# Patient Record
Sex: Male | Born: 1940 | Race: Black or African American | Hispanic: No | Marital: Married | Smoking: Never smoker
Health system: Southern US, Community
[De-identification: ages and names within clinical notes are randomized; demographics above are authoritative.]

## PROBLEM LIST (undated history)

## (undated) DIAGNOSIS — J449 Chronic obstructive pulmonary disease, unspecified: Secondary | ICD-10-CM

## (undated) DIAGNOSIS — K7400 Hepatic fibrosis, unspecified: Secondary | ICD-10-CM

## (undated) DIAGNOSIS — M2550 Pain in unspecified joint: Secondary | ICD-10-CM

## (undated) DIAGNOSIS — K219 Gastro-esophageal reflux disease without esophagitis: Secondary | ICD-10-CM

## (undated) DIAGNOSIS — I639 Cerebral infarction, unspecified: Secondary | ICD-10-CM

## (undated) DIAGNOSIS — M1990 Unspecified osteoarthritis, unspecified site: Secondary | ICD-10-CM

## (undated) DIAGNOSIS — R195 Other fecal abnormalities: Secondary | ICD-10-CM

## (undated) DIAGNOSIS — B1920 Unspecified viral hepatitis C without hepatic coma: Secondary | ICD-10-CM

## (undated) DIAGNOSIS — K760 Fatty (change of) liver, not elsewhere classified: Secondary | ICD-10-CM

## (undated) DIAGNOSIS — R748 Abnormal levels of other serum enzymes: Secondary | ICD-10-CM

## (undated) DIAGNOSIS — I10 Essential (primary) hypertension: Secondary | ICD-10-CM

## (undated) DIAGNOSIS — J45909 Unspecified asthma, uncomplicated: Secondary | ICD-10-CM

---

## 2006-07-25 ENCOUNTER — Other Ambulatory Visit: Payer: Self-pay

## 2006-07-25 ENCOUNTER — Emergency Department: Payer: Self-pay

## 2006-08-28 ENCOUNTER — Emergency Department: Payer: Self-pay | Admitting: Emergency Medicine

## 2008-07-20 ENCOUNTER — Ambulatory Visit: Payer: Self-pay | Admitting: Oncology

## 2008-07-30 LAB — CBC WITH DIFFERENTIAL (CANCER CENTER ONLY)
BASO#: 0 10*3/uL (ref 0.0–0.2)
EOS%: 2.4 % (ref 0.0–7.0)
HCT: 37.3 % — ABNORMAL LOW (ref 38.7–49.9)
HGB: 12.8 g/dL — ABNORMAL LOW (ref 13.0–17.1)
LYMPH%: 30.3 % (ref 14.0–48.0)
MCH: 31.9 pg (ref 28.0–33.4)
MCHC: 34.4 g/dL (ref 32.0–35.9)
MCV: 93 fL (ref 82–98)
MONO%: 10.2 % (ref 0.0–13.0)
NEUT#: 2.7 10*3/uL (ref 1.5–6.5)
NEUT%: 56.6 % (ref 40.0–80.0)

## 2008-07-30 LAB — CMP (CANCER CENTER ONLY)
ALT(SGPT): 150 U/L — ABNORMAL HIGH (ref 10–47)
Albumin: 3.7 g/dL (ref 3.3–5.5)
CO2: 29 mEq/L (ref 18–33)
Calcium: 9.2 mg/dL (ref 8.0–10.3)
Chloride: 97 mEq/L — ABNORMAL LOW (ref 98–108)
Glucose, Bld: 102 mg/dL (ref 73–118)
Potassium: 4.2 mEq/L (ref 3.3–4.7)
Sodium: 139 mEq/L (ref 128–145)
Total Bilirubin: 0.7 mg/dl (ref 0.20–1.60)
Total Protein: 9 g/dL — ABNORMAL HIGH (ref 6.4–8.1)

## 2008-07-30 LAB — MORPHOLOGY - CHCC SATELLITE

## 2008-07-31 LAB — VITAMIN B12: Vitamin B-12: 296 pg/mL (ref 211–911)

## 2008-07-31 LAB — FERRITIN: Ferritin: 151 ng/mL (ref 22–322)

## 2008-07-31 LAB — IRON AND TIBC
%SAT: 49 % (ref 20–55)
Iron: 165 ug/dL (ref 42–165)

## 2008-07-31 LAB — KAPPA/LAMBDA LIGHT CHAINS
Kappa free light chain: 3.5 mg/dL — ABNORMAL HIGH (ref 0.33–1.94)
Lambda Free Lght Chn: 3.17 mg/dL — ABNORMAL HIGH (ref 0.57–2.63)

## 2008-08-16 LAB — CBC WITH DIFFERENTIAL (CANCER CENTER ONLY)
BASO%: 0.6 % (ref 0.0–2.0)
Eosinophils Absolute: 0.2 10*3/uL (ref 0.0–0.5)
LYMPH%: 27.8 % (ref 14.0–48.0)
MCV: 93 fL (ref 82–98)
MONO#: 0.6 10*3/uL (ref 0.1–0.9)
MONO%: 10.1 % (ref 0.0–13.0)
NEUT#: 3.1 10*3/uL (ref 1.5–6.5)
Platelets: 179 10*3/uL (ref 145–400)
RBC: 4.17 10*6/uL — ABNORMAL LOW (ref 4.20–5.70)
RDW: 11.9 % (ref 10.5–14.6)
WBC: 5.4 10*3/uL (ref 4.0–10.0)

## 2009-04-12 ENCOUNTER — Ambulatory Visit: Payer: Self-pay | Admitting: Oncology

## 2009-04-16 LAB — CBC WITH DIFFERENTIAL (CANCER CENTER ONLY)
BASO%: 0.7 % (ref 0.0–2.0)
Eosinophils Absolute: 0.1 10*3/uL (ref 0.0–0.5)
LYMPH%: 30.5 % (ref 14.0–48.0)
MCH: 31 pg (ref 28.0–33.4)
MONO%: 10.2 % (ref 0.0–13.0)
NEUT#: 2.8 10*3/uL (ref 1.5–6.5)
Platelets: 138 10*3/uL — ABNORMAL LOW (ref 145–400)
RBC: 4.54 10*6/uL (ref 4.20–5.70)
RDW: 12.4 % (ref 10.5–14.6)
WBC: 5.1 10*3/uL (ref 4.0–10.0)

## 2009-04-16 LAB — CMP (CANCER CENTER ONLY)
ALT(SGPT): 61 U/L — ABNORMAL HIGH (ref 10–47)
AST: 64 U/L — ABNORMAL HIGH (ref 11–38)
Albumin: 3.8 g/dL (ref 3.3–5.5)
Alkaline Phosphatase: 60 U/L (ref 26–84)
Glucose, Bld: 100 mg/dL (ref 73–118)
Potassium: 4.3 mEq/L (ref 3.3–4.7)
Sodium: 141 mEq/L (ref 128–145)
Total Bilirubin: 0.7 mg/dl (ref 0.20–1.60)
Total Protein: 9 g/dL — ABNORMAL HIGH (ref 6.4–8.1)

## 2012-10-08 ENCOUNTER — Emergency Department: Payer: Self-pay | Admitting: Emergency Medicine

## 2014-05-28 ENCOUNTER — Ambulatory Visit
Admit: 2014-05-28 | Disposition: A | Discharge status: Home / Self Care | Payer: Self-pay | Attending: Gastroenterology | Admitting: Gastroenterology

## 2014-05-30 ENCOUNTER — Ambulatory Visit
Admit: 2014-05-30 | Disposition: A | Discharge status: Home / Self Care | Payer: Self-pay | Attending: Gastroenterology | Admitting: Gastroenterology

## 2015-04-27 ENCOUNTER — Encounter: Payer: Self-pay | Admitting: Emergency Medicine

## 2015-04-27 ENCOUNTER — Emergency Department
Admission: EM | Admit: 2015-04-27 | Discharge: 2015-04-28 | Disposition: A | Discharge status: Home / Self Care | Payer: Medicare Other | Attending: Emergency Medicine | Admitting: Emergency Medicine

## 2015-04-27 DIAGNOSIS — F1010 Alcohol abuse, uncomplicated: Secondary | ICD-10-CM | POA: Insufficient documentation

## 2015-04-27 DIAGNOSIS — T7840XA Allergy, unspecified, initial encounter: Secondary | ICD-10-CM | POA: Diagnosis present

## 2015-04-27 DIAGNOSIS — I10 Essential (primary) hypertension: Secondary | ICD-10-CM | POA: Diagnosis not present

## 2015-04-27 DIAGNOSIS — Z720 Tobacco use: Secondary | ICD-10-CM | POA: Diagnosis not present

## 2015-04-27 DIAGNOSIS — J45909 Unspecified asthma, uncomplicated: Secondary | ICD-10-CM | POA: Insufficient documentation

## 2015-04-27 DIAGNOSIS — I639 Cerebral infarction, unspecified: Secondary | ICD-10-CM | POA: Insufficient documentation

## 2015-04-27 DIAGNOSIS — T782XXA Anaphylactic shock, unspecified, initial encounter: Secondary | ICD-10-CM

## 2015-04-27 DIAGNOSIS — T7802XA Anaphylactic reaction due to shellfish (crustaceans), initial encounter: Secondary | ICD-10-CM | POA: Insufficient documentation

## 2015-04-27 HISTORY — DX: Unspecified viral hepatitis C without hepatic coma: B19.20

## 2015-04-27 HISTORY — DX: Essential (primary) hypertension: I10

## 2015-04-27 HISTORY — DX: Cerebral infarction, unspecified: I63.9

## 2015-04-27 HISTORY — DX: Chronic obstructive pulmonary disease, unspecified: J44.9

## 2015-04-27 HISTORY — DX: Unspecified asthma, uncomplicated: J45.909

## 2015-04-27 MED ORDER — DEXAMETHASONE SODIUM PHOSPHATE 10 MG/ML IJ SOLN
INTRAMUSCULAR | Status: AC
  Administered 2015-04-27: 10 mg via INTRAVENOUS
  Filled 2015-04-27: qty 1

## 2015-04-27 MED ORDER — FAMOTIDINE IN NACL 20-0.9 MG/50ML-% IV SOLN
20.0000 mg | Freq: Once | INTRAVENOUS | Status: AC
  Administered 2015-04-27: 20 mg via INTRAVENOUS

## 2015-04-27 MED ORDER — EPINEPHRINE HCL 1 MG/ML IJ SOLN: 1.0000 mg | Freq: Once | INTRAMUSCULAR | Status: DC

## 2015-04-27 MED ORDER — ALBUTEROL SULFATE (2.5 MG/3ML) 0.083% IN NEBU
INHALATION_SOLUTION | RESPIRATORY_TRACT | Status: AC
  Filled 2015-04-27: qty 3

## 2015-04-27 MED ORDER — EPINEPHRINE HCL 1 MG/ML IJ SOLN
0.3000 mg | Freq: Once | INTRAMUSCULAR | Status: AC
  Administered 2015-04-27: 0.3 mg via INTRAMUSCULAR

## 2015-04-27 MED ORDER — ALBUTEROL SULFATE (2.5 MG/3ML) 0.083% IN NEBU
2.5000 mg | INHALATION_SOLUTION | Freq: Once | RESPIRATORY_TRACT | Status: AC
  Administered 2015-04-27: 2.5 mg via RESPIRATORY_TRACT

## 2015-04-27 MED ORDER — SODIUM CHLORIDE 0.9 % IV BOLUS (SEPSIS)
1000.0000 mL | Freq: Once | INTRAVENOUS | Status: AC
  Administered 2015-04-27: 1000 mL via INTRAVENOUS

## 2015-04-27 MED ORDER — PREDNISONE 20 MG PO TABS: 60.0000 mg | ORAL_TABLET | Freq: Every day | ORAL | Status: DC

## 2015-04-27 MED ORDER — EPINEPHRINE 0.3 MG/0.3ML IJ SOAJ: 0.3000 mg | Freq: Once | INTRAMUSCULAR | Status: DC

## 2015-04-27 MED ORDER — ALBUTEROL SULFATE (2.5 MG/3ML) 0.083% IN NEBU
INHALATION_SOLUTION | RESPIRATORY_TRACT | Status: AC
  Administered 2015-04-27: 2.5 mg via RESPIRATORY_TRACT
  Filled 2015-04-27: qty 3

## 2015-04-27 MED ORDER — EPINEPHRINE HCL 1 MG/ML IJ SOLN
INTRAMUSCULAR | Status: AC
  Filled 2015-04-27: qty 1

## 2015-04-27 MED ORDER — EPINEPHRINE HCL 0.1 MG/ML IJ SOSY
PREFILLED_SYRINGE | INTRAMUSCULAR | Status: AC
  Filled 2015-04-27: qty 10

## 2015-04-27 MED ORDER — FAMOTIDINE IN NACL 20-0.9 MG/50ML-% IV SOLN
INTRAVENOUS | Status: AC
  Administered 2015-04-27: 20 mg via INTRAVENOUS
  Filled 2015-04-27: qty 50

## 2015-04-27 MED ORDER — DEXAMETHASONE SODIUM PHOSPHATE 10 MG/ML IJ SOLN
10.0000 mg | Freq: Once | INTRAMUSCULAR | Status: AC
  Administered 2015-04-27: 10 mg via INTRAVENOUS

## 2015-04-27 NOTE — ED Notes (Signed)
Patient SpO2 dropped to 86%. Placed on 5L [place]. MD made aware.

## 2015-04-27 NOTE — ED Notes (Signed)
Patient here for allergic reaction. Allergic to shrimp. Patient ate shrimp, didn't think he would have "this bad of a reaction". BP 84/61. MD at bedside. Voice is hoarse.

## 2015-04-27 NOTE — ED Notes (Signed)
Pt O2 at 98% RA on [place] 3L, decreased O2 to 2L. Pt O2 sat at 96

## 2015-04-27 NOTE — ED Notes (Signed)
Patient stating 99% on 5L [place], turned down to 3L [place]. SpO2 97%.

## 2015-04-27 NOTE — Discharge Instructions (Signed)
Anaphylactic Reaction °An anaphylactic reaction is a sudden, severe allergic reaction. It affects the whole body. It can be life threatening. You may need to stay in the hospital.  °HOME CARE °· Wear a medical bracelet or necklace that lists your allergy. °· Carry your allergy kit or medicine shot to treat severe allergic reactions with you. These can save your life. °· Do not drive until medicine from your shot has worn off, unless your doctor says it is okay. °· If you have hives or a rash: °¨ Take medicine as told by your doctor. °¨ You may take over-the-counter antihistamine medicine. °¨ Place cold cloths on your skin. Take baths in cool water. Avoid hot baths and hot showers. °GET HELP RIGHT AWAY IF:  °· Your mouth is puffy (swollen), or you have trouble breathing. °· You start making whistling sounds when you breathe (wheezing). °· You have a tight feeling in your chest or throat. °· You have a rash, hives, puffiness, or itching on your body. °· You throw up (vomit) or have watery poop (diarrhea). °· You feel dizzy or pass out (faint). °· You think you are having an allergic reaction. °· You have new symptoms. °This is an emergency. Use your medicine shot or allergy kit as told. Call your local emergency services (911 in [place] Even if you feel better after the shot, you need to go to the hospital emergency department. °MAKE SURE YOU:  °· Understand these instructions. °· Will watch your condition. °· Will get help right away if you are not doing well or get worse. °  °This information is not intended to replace advice given to you by your health care provider. Make sure you discuss any questions you have with your health care provider. °  °Document Released: 07/15/2007 Document Revised: 07/28/2011 Document Reviewed: 08/08/2014 °Elsevier Interactive Patient Education ©2016 [employer] Inc. ° °

## 2015-04-27 NOTE — ED Provider Notes (Addendum)
[facility] Emergency Department Provider Note  ____________________________________________  Time seen: Seen upon arrival to the emergency department  I have reviewed the triage vital signs and the nursing notes.   HISTORY  Chief Complaint Allergic Reaction    HPI Larry Parsons is a 75 y.o. male with a history of a shellfish allergy who ate shrimp about an hour prior to arrival. Soon after eating the shrimp she began to become itchy as well as have a hoarse voice. His symptoms have progressed and he came to the emergency department because of them. He does not have an EpiPen at home. He did take 50 mg of Benadryl prior to arrival without any improvement. He says he feels like his throat is currently closing.   Past Medical History  Diagnosis Date  . Asthma   . Hypertension   . Stroke ([place])   . Hepatitis C     There are no active problems to display for this patient.   No past surgical history on file.  Current Outpatient Rx  Name  Route  Sig  Dispense  Refill  . allopurinol (ZYLOPRIM) 100 MG tablet   Oral   Take 100 mg by mouth daily.         Marland Kitchen amLODipine (NORVASC) 10 MG tablet   Oral   Take 10 mg by mouth daily.         . carvedilol (COREG) 25 MG tablet   Oral   Take 25 mg by mouth 2 (two) times daily with a meal.         . febuxostat (ULORIC) 40 MG tablet   Oral   Take 40 mg by mouth daily.         . montelukast (SINGULAIR) 10 MG tablet   Oral   Take 10 mg by mouth at bedtime.         Marland Kitchen omega-3 acid ethyl esters (LOVAZA) 1 g capsule   Oral   Take 1 g by mouth daily.         Marland Kitchen omeprazole (PRILOSEC) 20 MG capsule   Oral   Take 20 mg by mouth daily.         . tamsulosin (FLOMAX) 0.4 MG CAPS capsule   Oral   Take 0.4 mg by mouth daily.         Marland Kitchen tiotropium (SPIRIVA) 18 MCG inhalation capsule   Inhalation   Place 18 mcg into inhaler and inhale daily.           Allergies Shrimp  No family history on  file.  Social History Social History  Substance Use Topics  . Smoking status: Never Smoker   . Smokeless tobacco: Current User    Types: Chew  . Alcohol Use: Yes     Comment: 3x/week (1 beer)    Review of Systems Constitutional: No fever/chills Eyes: No visual changes. ENT: No sore throat. Cardiovascular: Denies chest pain. Respiratory: Reports difficulty breathing. Gastrointestinal: No abdominal pain.  No nausea, no vomiting.  No diarrhea.  No constipation. Genitourinary: Negative for dysuria. Musculoskeletal: Negative for back pain. Skin: Itchiness diffusely. Neurological: Negative for headaches, focal weakness or numbness.  10-point ROS otherwise negative.  ____________________________________________   PHYSICAL EXAM:  VITAL SIGNS: ED Triage Vitals  Enc Vitals Group     BP 04/27/15 2159 84/61 mmHg     Pulse Rate 04/27/15 2159 70     Resp 04/27/15 2159 15     Temp 04/27/15 2159 97.4 F (36.3 C)  Temp src --      SpO2 04/27/15 2159 97 %     Weight 04/27/15 2159 180 lb (81.647 kg)     Height 04/27/15 2159 5\' 9"  (1.753 m)     Head Cir --      Peak Flow --      Pain Score --      Pain Loc --      Pain Edu? --      Excl. in [place]? --     Constitutional: Alert and oriented. Well appearing and in no acute distress. Eyes: Conjunctivae are normal. PERRL. EOMI. Head: Atraumatic. Nose: No congestion/rhinnorhea. Mouth/Throat: Mucous membranes are moist.  Oropharynx non-erythematous.Controlling his secretions. No tongue or uvular swelling. Neck: No stridor.   Cardiovascular: Normal rate, regular rhythm. Grossly normal heart sounds.  Good peripheral circulation. Respiratory: Normal respiratory effort.  Severely decreased air movement throughout. Gastrointestinal: Soft and nontender. No distention. No abdominal bruits. No CVA tenderness. Musculoskeletal: No lower extremity tenderness nor edema.  No joint effusions. Neurologic:  Normal speech and language. No gross  focal neurologic deficits are appreciated. No gait instability. Skin: Urticarial rash to the bilateral upper extremities which is more pronounced on the right upper extremity. Psychiatric: Mood and affect are normal. Speech and behavior are normal.  ____________________________________________   LABS (all labs ordered are listed, but only abnormal results are displayed)  Labs Reviewed - No data to display ____________________________________________  EKG  ED ECG REPORT I, [place],  Youlanda Roys, the attending physician, personally viewed and interpreted this ECG.   Date: 04/27/2015  EKG Time: 2156  Rate: 69  Rhythm: normal sinus rhythm  Axis: Normal  Intervals:Prolonged QT.  ST&T Change: No ST segment elevation or depression. No abnormal T-wave inversion.  ____________________________________________  RADIOLOGY   ____________________________________________   PROCEDURES  CRITICAL CARE Performed by: Doran Stabler   Total critical care time: 35 minutes  Critical care time was exclusive of separately billable procedures and treating other patients.  Critical care was necessary to treat or prevent imminent or life-threatening deterioration.  Critical care was time spent personally by me on the following activities: development of treatment plan with patient and/or surrogate as well as nursing, discussions with consultants, evaluation of patient's response to treatment, examination of patient, obtaining history from patient or surrogate, ordering and performing treatments and interventions, ordering and review of laboratory studies, ordering and review of radiographic studies, pulse oximetry and re-evaluation of patient's condition.  Patient initially in anaphylactic shock with hypoxia to 82-85% on room air as well as systolic pressure as low as 82. Was initially given epinephrine, albuterol, Pepcid as well as Decadron  IV. ____________________________________________   INITIAL IMPRESSION / ASSESSMENT AND PLAN / ED COURSE  Pertinent labs & imaging results that were available during my care of the patient were reviewed by me and considered in my medical decision making (see chart for details).  ----------------------------------------- 11:51 PM on 04/27/2015 -----------------------------------------  Patient at this point has been able to be weaned down to 2-3 L on nasal cannula and is satting at 98%. His voice is less hoarse. He is clear to auscultation bilaterally. His rash has resolved as well as itchiness. We will continue to observe him for a full 4 hours because of his initial anaphylactic shock. Signed out to Dr. Dahlia Client. I discussed this. Of observation with the patient as well as the family. They know that if he continues to improve that he will likely be able to be discharged home  with a seven-day course of steroids and an EpiPen as needed. He knows that he must never returned again or any other shellfish. EKG done by ER tech. Patient never experience any chest pain. ____________________________________________   FINAL CLINICAL IMPRESSION(S) / ED DIAGNOSES  Anaphylactic shock.    Orbie Pyo, MD 04/27/15 [phone]  Signed out to Dr. Dahlia Client.    Orbie Pyo, MD 04/27/15 [phone]

## 2015-04-28 ENCOUNTER — Emergency Department: Payer: Medicare Other

## 2015-04-28 LAB — CBC
HCT: 43.1 % (ref 40.0–52.0)
HEMOGLOBIN: 14.1 g/dL (ref 13.0–18.0)
MCH: 30.7 pg (ref 26.0–34.0)
MCHC: 32.7 g/dL (ref 32.0–36.0)
MCV: 93.9 fL (ref 80.0–100.0)
Platelets: 146 10*3/uL — ABNORMAL LOW (ref 150–440)
RBC: 4.59 MIL/uL (ref 4.40–5.90)
RDW: 13.4 % (ref 11.5–14.5)
WBC: 7.5 10*3/uL (ref 3.8–10.6)

## 2015-04-28 LAB — BASIC METABOLIC PANEL
Anion gap: 7 (ref 5–15)
BUN: 15 mg/dL (ref 6–20)
CHLORIDE: 105 mmol/L (ref 101–111)
CO2: 24 mmol/L (ref 22–32)
Calcium: 8.7 mg/dL — ABNORMAL LOW (ref 8.9–10.3)
Creatinine, Ser: 1.07 mg/dL (ref 0.61–1.24)
GFR calc Af Amer: >60 mL/min (ref >60)
GFR calc non Af Amer: >60 mL/min (ref >60)
Glucose, Bld: 137 mg/dL — ABNORMAL HIGH (ref 65–99)
POTASSIUM: 3.7 mmol/L (ref 3.5–5.1)
SODIUM: 136 mmol/L (ref 135–145)

## 2015-04-28 LAB — TROPONIN I

## 2015-04-28 NOTE — ED Notes (Signed)
Pt O2 sat decreased to 87% on RA. Placed pt on 0.5L [place]. Pt O2 sat  92% on RA

## 2015-04-28 NOTE — ED Notes (Signed)
Informed MD Dahlia Client of pt's decrease in O2 sat.

## 2015-04-28 NOTE — ED Notes (Signed)
Pt asked if he was having any SOB, or discomfort. Pt reported he was having mild chest tightness. Pt reported he often has same sensation at home, takes his singulair, and the discomfort goes away. Pt also reported he was recently diagnosed with COPD. COPD was added to pt's history. MD informed.

## 2015-04-28 NOTE — ED Notes (Signed)
Pt O2 sat at 98% on 2L [place]. Decreased O2 to 1L. Pt O2 Sat 97%.

## 2015-04-28 NOTE — ED Notes (Signed)
Pt taken to Xray.

## 2015-04-28 NOTE — ED Provider Notes (Signed)
-----------------------------------------   3:45 AM on 04/28/2015 -----------------------------------------   Blood pressure 147/88, pulse 90, temperature 97.4 F (36.3 C), resp. rate 14, height 5\' 9"  (1.753 m), weight 180 lb (81.647 kg), SpO2 93 %.  Assuming care from Dr. Clearnce Hasten.  In short, Larry Parsons is a 75 y.o. male with a chief complaint of Allergic Reaction .  Refer to the original H&P for additional details.  The current plan of care is to the patient for 2 more hours to ensure that he does not have a return of his symptoms..  We took the patient off of his nasal cannula and he had some episodes of hypoxia. Initially the concern was that he was having some shortness of breath or chest tightness from a return of the symptoms. We initially placed the patient back on nasal cannula and continue to monitor him. After talking with the patient he does have a diagnosis of COPD and reports that he does sometimes have some lower oxygen saturations before he takes his Spriva at night. I did go in to listen to the patient and his lung sounds were clear. He reports that some of the breathing feels more like his COPD shortness of breath. The patient reports that his voice is no longer hoarse and he feels much better than he did when he initially came into the hospital. I allowed the patient to take his Spriva and we also reassess the patient's oxygen saturation. The patient's sats in the high 80s seemed to get lower when he had his blood pressure taken on the arm which is where the oxygen probe is. At this time I feel that the low oxygen results are spurious because he is having some normal oxygen saturation on room air at this time. The patient reports that he feels comfortable being discharged home. The patient be discharged to follow-up with his primary care physician.  We did do some blood work in the emergency department and it was all unremarkable.  Chest x-ray: No acute cardiopulmonary findings,  curvilinear scarring in both bases.  ED ECG REPORT I, Loney Hering, the attending physician, personally viewed and interpreted this ECG.   Date: 04/28/2015  EKG Time: 110  Rate: 87  Rhythm: normal sinus rhythm  Axis: none  Intervals:none  ST&T Change: none    Loney Hering, MD 04/28/15 [phone]

## 2015-04-28 NOTE — ED Notes (Signed)
Pt O2 sat 95% on 1L [place]. Took pt off O2, Pt O2 sat 93% on RA

## 2016-09-16 IMAGING — US ABDOMEN ULTRASOUND LIMITED
1 series · 14 of 25 positions shown · non-contrast
Comparison: None.

CLINICAL DATA: Evaluate for cirrhosis, hepatitis-C

EXAM:
US ABDOMEN LIMITED - RIGHT UPPER QUADRANT

[Series 1: abdomen ultrasound limited · 0.20mm/px · 14 of 105 slices shown]
[im 1/105]
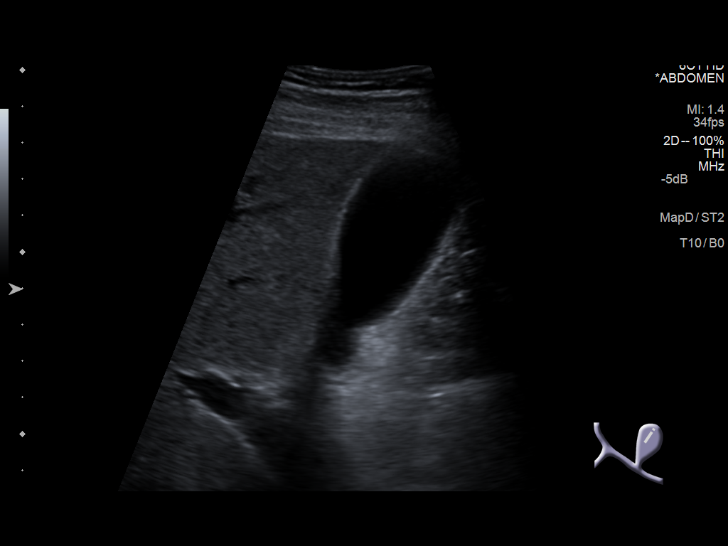
[im 9/105]
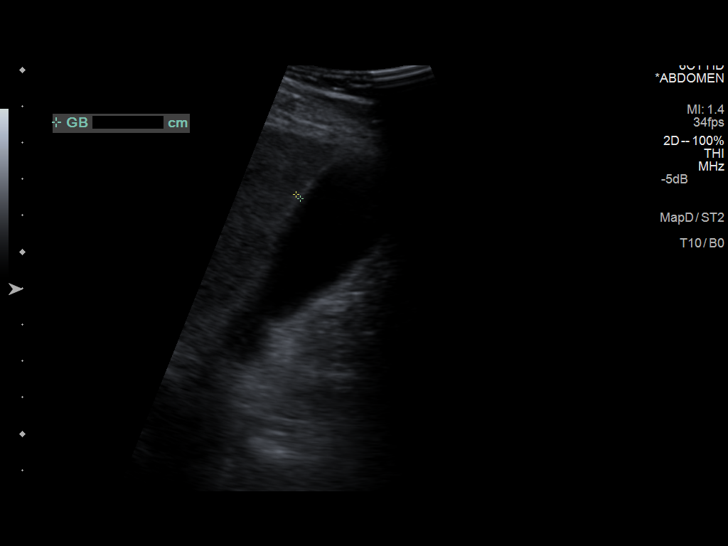
[im 18/105]
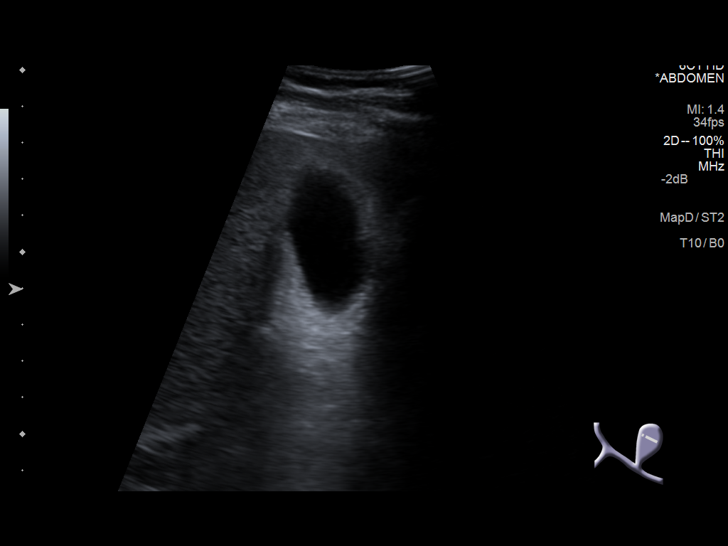
[im 27/105]
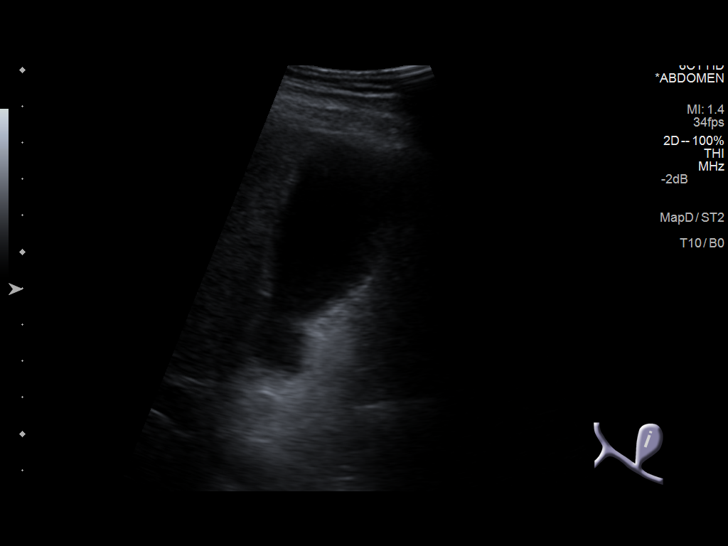
[im 35/105]
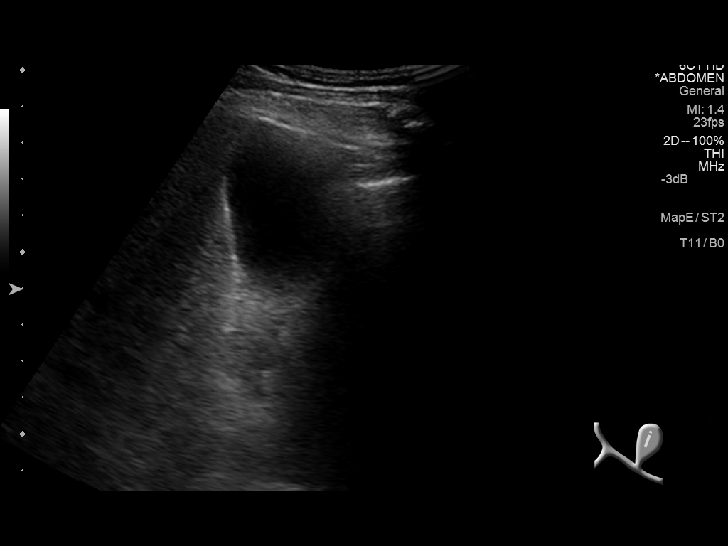
[im 40/105]
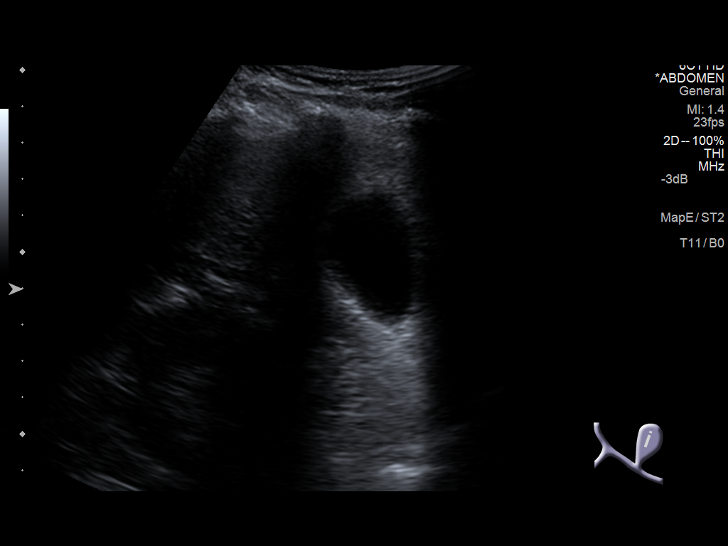
[im 48/105]
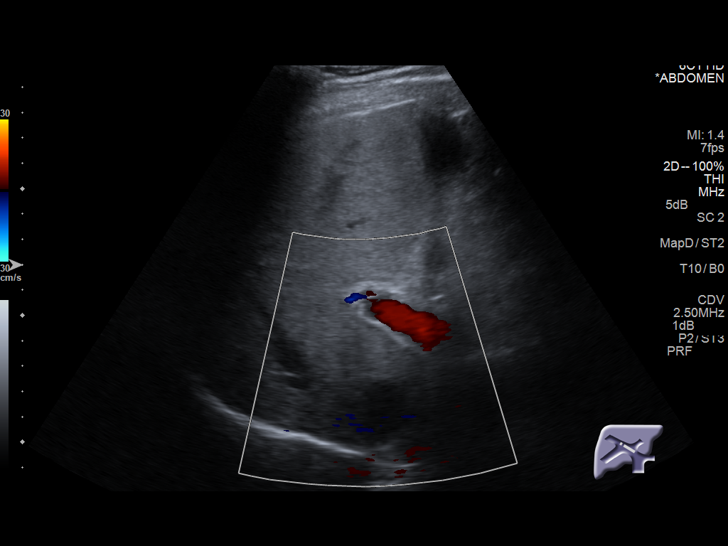
[im 57/105]
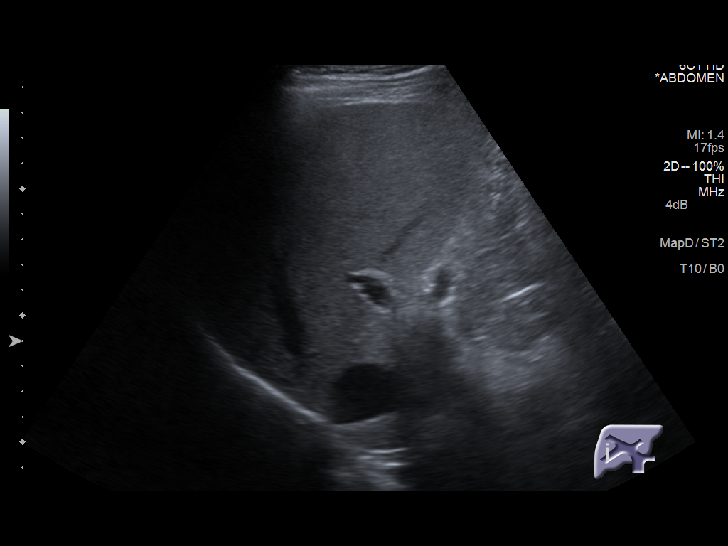
[im 66/105]
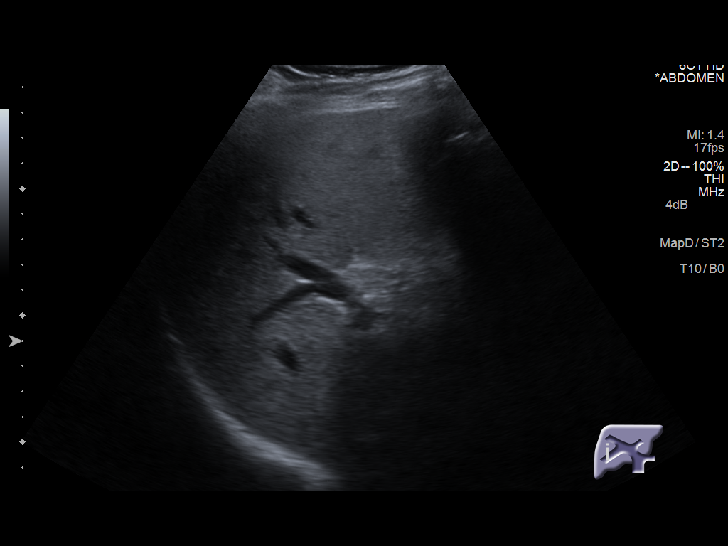
[im 70/105]
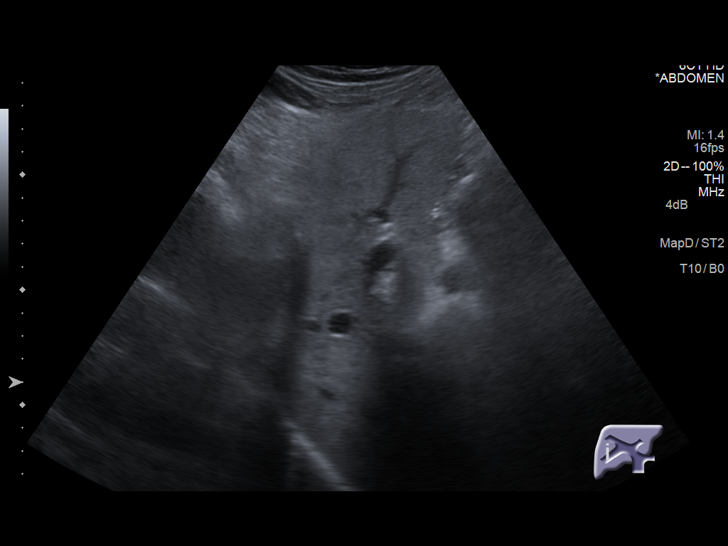
[im 79/105]
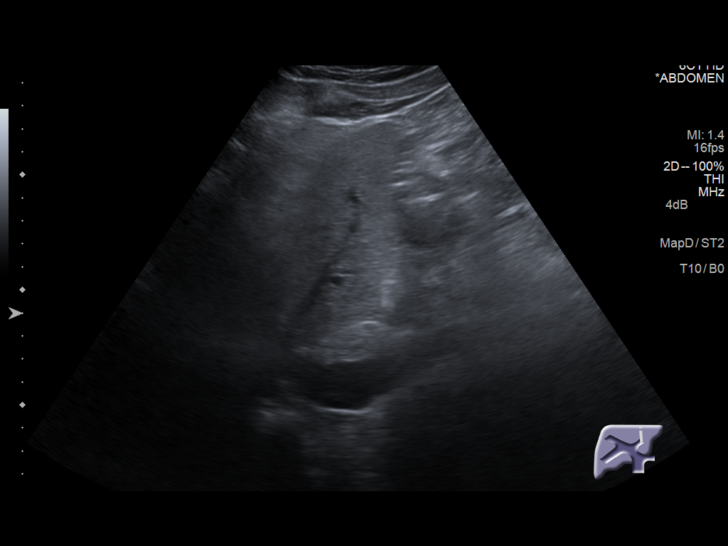
[im 87/105]
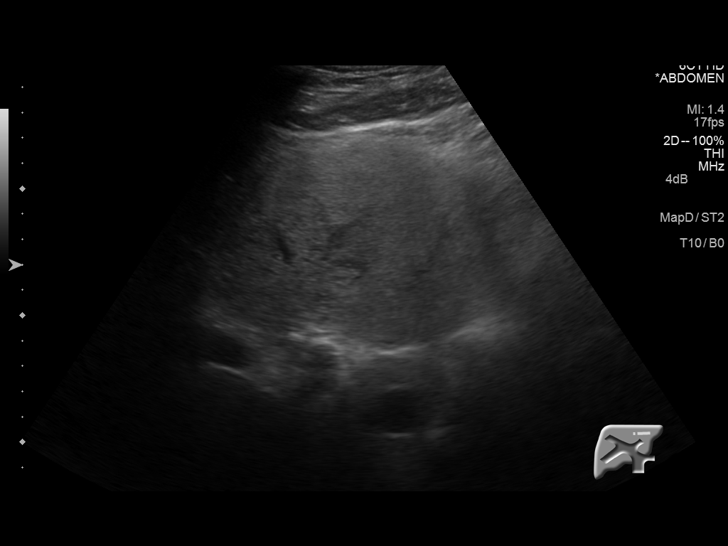
[im 96/105]
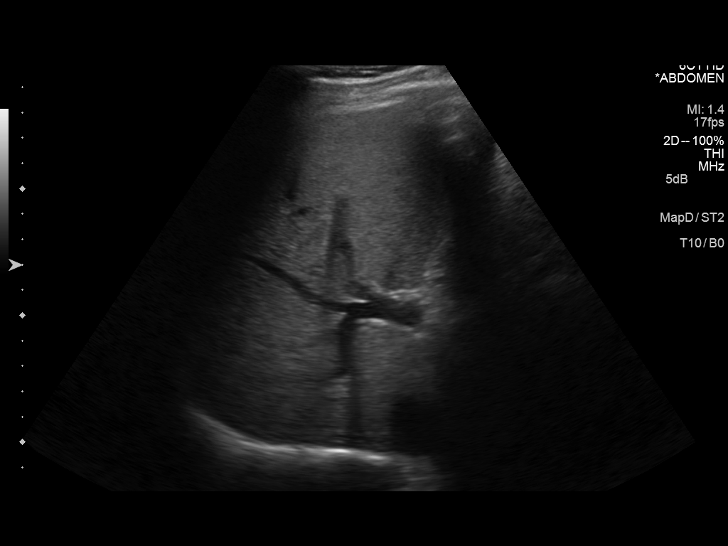
[im 105/105]
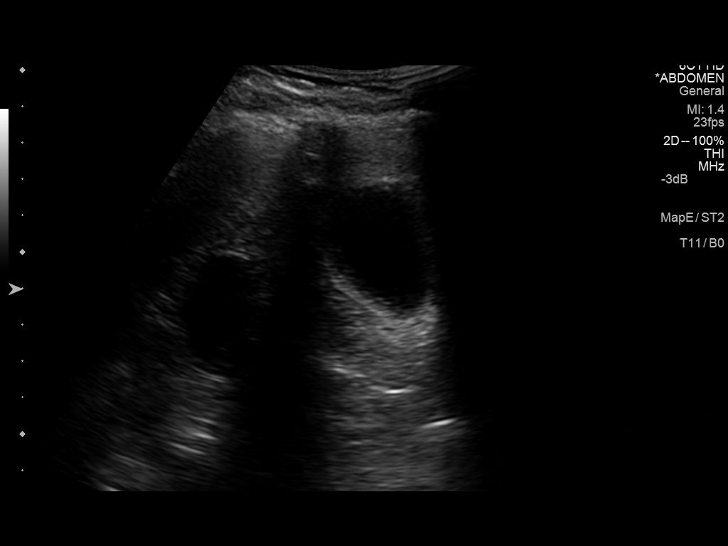

[14 of 25 positions shown; findings below may reference images not displayed]

FINDINGS: Gallbladder:

No gallstones or wall thickening visualized. No sonographic Murphy
sign noted.

Common bile duct:

Diameter: 4.8 mm in diameter within normal limits.

Liver:

No focal lesion identified. Mild increased echogenicity of the liver
suspicious for chronic hepatitis or fatty infiltration.
IMPRESSION: 1. No gallstones.  Normal CBD.
2. Mild increased echogenicity of the liver suspicious for fatty
infiltration or chronic hepatitis. No focal hepatic mass.

## 2017-03-17 ENCOUNTER — Encounter: Payer: Self-pay | Admitting: *Deleted

## 2017-03-17 ENCOUNTER — Ambulatory Visit
Admission: RE | Admit: 2017-03-17 | Discharge: 2017-03-17 | Disposition: A | Discharge status: Home / Self Care | Payer: Medicare Other | Source: Ambulatory Visit | Attending: Internal Medicine | Admitting: Internal Medicine

## 2017-03-17 ENCOUNTER — Other Ambulatory Visit: Payer: Self-pay

## 2017-03-17 ENCOUNTER — Ambulatory Visit: Payer: Medicare Other | Admitting: Anesthesiology

## 2017-03-17 ENCOUNTER — Encounter
Admission: RE | Disposition: A | Discharge status: Home / Self Care | Payer: Self-pay | Source: Ambulatory Visit | Attending: Internal Medicine

## 2017-03-17 DIAGNOSIS — D123 Benign neoplasm of transverse colon: Secondary | ICD-10-CM | POA: Diagnosis not present

## 2017-03-17 DIAGNOSIS — Z1211 Encounter for screening for malignant neoplasm of colon: Secondary | ICD-10-CM | POA: Insufficient documentation

## 2017-03-17 DIAGNOSIS — K640 First degree hemorrhoids: Secondary | ICD-10-CM | POA: Diagnosis not present

## 2017-03-17 DIAGNOSIS — Z91013 Allergy to seafood: Secondary | ICD-10-CM | POA: Insufficient documentation

## 2017-03-17 DIAGNOSIS — C19 Malignant neoplasm of rectosigmoid junction: Secondary | ICD-10-CM | POA: Insufficient documentation

## 2017-03-17 DIAGNOSIS — K5730 Diverticulosis of large intestine without perforation or abscess without bleeding: Secondary | ICD-10-CM | POA: Diagnosis not present

## 2017-03-17 DIAGNOSIS — Z8673 Personal history of transient ischemic attack (TIA), and cerebral infarction without residual deficits: Secondary | ICD-10-CM | POA: Insufficient documentation

## 2017-03-17 DIAGNOSIS — Z79899 Other long term (current) drug therapy: Secondary | ICD-10-CM | POA: Diagnosis not present

## 2017-03-17 DIAGNOSIS — K740 Hepatic fibrosis: Secondary | ICD-10-CM | POA: Diagnosis not present

## 2017-03-17 DIAGNOSIS — Z888 Allergy status to other drugs, medicaments and biological substances status: Secondary | ICD-10-CM | POA: Diagnosis not present

## 2017-03-17 DIAGNOSIS — B1920 Unspecified viral hepatitis C without hepatic coma: Secondary | ICD-10-CM | POA: Diagnosis not present

## 2017-03-17 DIAGNOSIS — I10 Essential (primary) hypertension: Secondary | ICD-10-CM | POA: Insufficient documentation

## 2017-03-17 DIAGNOSIS — K760 Fatty (change of) liver, not elsewhere classified: Secondary | ICD-10-CM | POA: Insufficient documentation

## 2017-03-17 DIAGNOSIS — J449 Chronic obstructive pulmonary disease, unspecified: Secondary | ICD-10-CM | POA: Diagnosis not present

## 2017-03-17 DIAGNOSIS — K219 Gastro-esophageal reflux disease without esophagitis: Secondary | ICD-10-CM | POA: Diagnosis not present

## 2017-03-17 DIAGNOSIS — Z880 Allergy status to penicillin: Secondary | ICD-10-CM | POA: Insufficient documentation

## 2017-03-17 HISTORY — DX: Hepatic fibrosis, unspecified: K74.00

## 2017-03-17 HISTORY — DX: Hepatic fibrosis: K74.0

## 2017-03-17 HISTORY — DX: Fatty (change of) liver, not elsewhere classified: K76.0

## 2017-03-17 HISTORY — DX: Unspecified osteoarthritis, unspecified site: M19.90

## 2017-03-17 HISTORY — DX: Abnormal levels of other serum enzymes: R74.8

## 2017-03-17 HISTORY — PX: COLONOSCOPY WITH PROPOFOL: SHX5780

## 2017-03-17 HISTORY — DX: Gastro-esophageal reflux disease without esophagitis: K21.9

## 2017-03-17 SURGERY — COLONOSCOPY WITH PROPOFOL
Anesthesia: General

## 2017-03-17 MED ORDER — PROPOFOL 10 MG/ML IV BOLUS
INTRAVENOUS | Status: DC | PRN
  Administered 2017-03-17 (×2): 20 mg via INTRAVENOUS
  Administered 2017-03-17: 40 mg via INTRAVENOUS

## 2017-03-17 MED ORDER — LIDOCAINE HCL (PF) 2 % IJ SOLN
INTRAMUSCULAR | Status: DC | PRN
  Administered 2017-03-17: 80 mg via INTRADERMAL

## 2017-03-17 MED ORDER — PROPOFOL 500 MG/50ML IV EMUL
INTRAVENOUS | Status: DC | PRN
  Administered 2017-03-17: 100 ug/kg/min via INTRAVENOUS
  Administered 2017-03-17: 80 ug/kg/min via INTRAVENOUS

## 2017-03-17 MED ORDER — SODIUM CHLORIDE 0.9 % IV SOLN
INTRAVENOUS | Status: DC
  Administered 2017-03-17 (×2): via INTRAVENOUS

## 2017-03-17 NOTE — Interval H&P Note (Signed)
History and Physical Interval Note:  03/17/2017 11:18 AM  Larry Parsons  has presented today for surgery, with the diagnosis of SCREENING  The various methods of treatment have been discussed with the patient and family. After consideration of risks, benefits and other options for treatment, the patient has consented to  Procedure(s): COLONOSCOPY WITH PROPOFOL (N/A) as a surgical intervention .  The patient's history has been reviewed, patient examined, no change in status, stable for surgery.  I have reviewed the patient's chart and labs.  Questions were answered to the patient's satisfaction.     [place], [place]

## 2017-03-17 NOTE — Anesthesia Postprocedure Evaluation (Signed)
Anesthesia Post Note  Patient: Buford Dresser  Procedure(s) Performed: COLONOSCOPY WITH PROPOFOL (N/A )  Patient location during evaluation: Endoscopy Anesthesia Type: General Level of consciousness: awake and alert Pain management: pain level controlled Vital Signs Assessment: post-procedure vital signs reviewed and stable Respiratory status: spontaneous breathing, nonlabored ventilation, respiratory function stable and patient connected to nasal cannula oxygen Cardiovascular status: blood pressure returned to baseline and stable Postop Assessment: no apparent nausea or vomiting Anesthetic complications: no     Last Vitals:  Vitals:   03/17/17 1033  BP: (!) 184/88  Pulse: (!) 56  Resp: 18  Temp: (!) 35.7 C  SpO2: 97%    Last Pain:  Vitals:   03/17/17 1033  TempSrc: Tympanic                 Arnetra Terris,  [facility]

## 2017-03-17 NOTE — Anesthesia Post-op Follow-up Note (Signed)
Anesthesia QCDR form completed.        

## 2017-03-17 NOTE — H&P (Signed)
Outpatient short stay form Pre-procedure 03/17/2017 11:15 AM Larry Parsons K. Alice Reichert, M.D.  Primary Physician: Venetia Maxon. Elijio Miles, M.D.  Reason for visit:  + Cologuard test  History of present illness:  Patient's pleasant 77 year old African-American male presenting with positive Cologuard screening test. Patient denies a change in bowel habits, rectal bleeding or abdominal pain.    Current Facility-Administered Medications:  .  0.9 %  sodium chloride infusion, , Intravenous, Continuous, Braxton, Benay Pike, MD, Last Rate: 20 mL/hr at 03/17/17 1050  Medications Prior to Admission  Medication Sig Dispense Refill Last Dose  . allopurinol (ZYLOPRIM) 100 MG tablet Take 100 mg by mouth daily.   03/16/2017 at Unknown time  . amLODipine (NORVASC) 10 MG tablet Take 10 mg by mouth daily.   03/16/2017 at Unknown time  . betamethasone dipropionate (DIPROLENE) 0.05 % cream Apply topically 2 (two) times daily.   03/16/2017 at Unknown time  . budesonide-formoterol (SYMBICORT) 80-4.5 MCG/ACT inhaler Inhale 2 puffs into the lungs 2 (two) times daily.   03/17/2017 at 0730  . carvedilol (COREG) 25 MG tablet Take 25 mg by mouth 2 (two) times daily with a meal.   03/16/2017 at Unknown time  . febuxostat (ULORIC) 40 MG tablet Take 40 mg by mouth daily.   03/16/2017 at Unknown time  . fexofenadine (ALLEGRA) 180 MG tablet Take 180 mg by mouth daily.   03/16/2017 at Unknown time  . fluticasone (FLONASE) 50 MCG/ACT nasal spray Place into both nostrils daily.   03/16/2017 at Unknown time  . montelukast (SINGULAIR) 10 MG tablet Take 10 mg by mouth at bedtime.   03/16/2017 at Unknown time  . omega-3 acid ethyl esters (LOVAZA) 1 g capsule Take 1 g by mouth daily.   Past Week at Unknown time  . omeprazole (PRILOSEC) 20 MG capsule Take 20 mg by mouth daily.   03/16/2017 at Unknown time  . tamsulosin (FLOMAX) 0.4 MG CAPS capsule Take 0.4 mg by mouth daily.   03/16/2017 at Unknown time  . tiotropium (SPIRIVA) 18 MCG inhalation capsule Place 18 mcg  into inhaler and inhale daily.   03/16/2017 at Unknown time  . EPINEPHrine (EPIPEN 2-PAK) 0.3 mg/0.3 mL IJ SOAJ injection Inject 0.3 mLs (0.3 mg total) into the muscle once. 1 Device 0   . predniSONE (DELTASONE) 20 MG tablet Take 3 tablets (60 mg total) by mouth daily with breakfast. 21 tablet 0      Allergies  Allergen Reactions  . Shrimp [Shellfish Allergy] Anaphylaxis  . Lisinopril   . Penicillins      Past Medical History:  Diagnosis Date  . Arthritis   . Asthma   . COPD (chronic obstructive pulmonary disease) ([place])   . Elevated liver enzymes   . Fatty liver   . GERD (gastroesophageal reflux disease)   . Hepatic fibrosis   . Hepatitis C   . Hypertension   . Stroke [facility])     Review of systems:   Negative except for that in history of present illness.   Physical Exam  General appearance: alert, cooperative and appears stated age Resp: clear to auscultation bilaterally Cardio: regular rate and rhythm, S1, S2 normal, no murmur, click, rub or gallop GI: soft, non-tender; bowel sounds normal; no masses,  no organomegaly Extremities: extremities normal, atraumatic, no cyanosis or edema     Planned procedures: Proceed with colonoscopy.The patient understands the nature of the planned procedure, indications, risks, alternatives and potential complications including but not limited to bleeding, infection, perforation, damage to internal organs  and possible oversedation/side effects from anesthesia. The patient agrees and gives consent to proceed.  Please refer to procedure notes for findings, recommendations and patient disposition/instructions.    Stepanie Graver K. Alice Reichert, M.D. Gastroenterology 03/17/2017  11:15 AM

## 2017-03-17 NOTE — Transfer of Care (Signed)
Immediate Anesthesia Transfer of Care Note  Patient: Larry Parsons  Procedure(s) Performed: COLONOSCOPY WITH PROPOFOL (N/A )  Patient Location: PACU and Endoscopy Unit  Anesthesia Type:General  Level of Consciousness: sedated  Airway & Oxygen Therapy: Patient Spontanous Breathing  Post-op Assessment: Report given to RN and Post -op Vital signs reviewed and stable  Post vital signs: Reviewed and stable  Last Vitals:  Vitals:   03/17/17 1033  BP: (!) 184/88  Pulse: (!) 56  Resp: 18  Temp: (!) 35.7 C  SpO2: 97%    Last Pain:  Vitals:   03/17/17 1033  TempSrc: Tympanic         Complications: No apparent anesthesia complications

## 2017-03-17 NOTE — Anesthesia Preprocedure Evaluation (Signed)
Anesthesia Evaluation  Patient identified by MRN, date of birth, ID band Patient awake    Reviewed: Allergy & Precautions, H&P , NPO status , Patient's Chart, lab work & pertinent test results  History of Anesthesia Complications Negative for: history of anesthetic complications  Airway Mallampati: III  TM Distance: <3 FB Neck ROM: limited    Dental  (+) Poor Dentition, Missing, Edentulous Upper, Edentulous Lower, Upper Dentures, Lower Dentures   Pulmonary asthma , COPD,           Cardiovascular hypertension, (-) angina(-) Past MI and (-) DOE      Neuro/Psych CVA, Residual Symptoms negative psych ROS   GI/Hepatic GERD  Medicated and Controlled,(+) Hepatitis -, C  Endo/Other  negative endocrine ROS  Renal/GU negative Renal ROS  negative genitourinary   Musculoskeletal   Abdominal   Peds  Hematology negative hematology ROS (+)   Anesthesia Other Findings Past Medical History: No date: Arthritis No date: Asthma No date: COPD (chronic obstructive pulmonary disease) ([facility]) No date: Elevated liver enzymes No date: Fatty liver No date: GERD (gastroesophageal reflux disease) No date: Hepatic fibrosis No date: Hepatitis C No date: Hypertension No date: Stroke [facility])  History reviewed. No pertinent surgical history.  BMI    Body Mass Index:  25.40 kg/m      Reproductive/Obstetrics negative OB ROS                             Anesthesia Physical Anesthesia Plan  ASA: III  Anesthesia Plan: General   Post-op Pain Management:    Induction: Intravenous  PONV Risk Score and Plan: Propofol infusion  Airway Management Planned: Natural Airway and Nasal Cannula  Additional Equipment:   Intra-op Plan:   Post-operative Plan:   Informed Consent: I have reviewed the patients History and Physical, chart, labs and discussed the procedure including the risks, benefits and alternatives for  the proposed anesthesia with the patient or authorized representative who has indicated his/her understanding and acceptance.   Dental Advisory Given  Plan Discussed with: Anesthesiologist, CRNA and Surgeon  Anesthesia Plan Comments: (Patient consented for risks of anesthesia including but not limited to:  - adverse reactions to medications - risk of intubation if required - damage to teeth, lips or other oral mucosa - sore throat or hoarseness - Damage to heart, brain, lungs or loss of life  Patient voiced understanding.)        Anesthesia Quick Evaluation

## 2017-03-17 NOTE — Op Note (Signed)
[facility] Gastroenterology Patient Name: Larry Parsons Procedure Date: 03/17/2017 11:16 AM MRN: [id number] Account #: [id number] Date of Birth: 09/27/1940 Admit Type: Outpatient Age: 77 Room: [facility] ENDO ROOM 3 Gender: Male Note Status: Finalized Procedure:            Colonoscopy Indications:          Positive Cologuard test Providers:            Benay Pike. Alice Reichert MD, MD Referring MD:         Venetia Maxon. Elijio Miles, MD (Referring MD) Medicines:            Propofol per Anesthesia Complications:        No immediate complications. Procedure:            Pre-Anesthesia Assessment:                       - The risks and benefits of the procedure and the                        sedation options and risks were discussed with the                        patient. All questions were answered and informed                        consent was obtained.                       - Patient identification and proposed procedure were                        verified prior to the procedure by the nurse. The                        procedure was verified in the procedure room.                       - ASA Grade Assessment: III - A patient with severe                        systemic disease.                       - After reviewing the risks and benefits, the patient                        was deemed in satisfactory condition to undergo the                        procedure.                       After obtaining informed consent, the colonoscope was                        passed under direct vision. Throughout the procedure,                        the patient's blood pressure, pulse, and oxygen  saturations were monitored continuously. The                        Colonoscope was introduced through the anus and                        advanced to the the cecum, identified by appendiceal                        orifice and ileocecal valve. The colonoscopy was                         performed without difficulty. The patient tolerated the                        procedure well. The quality of the bowel preparation                        was good. The ileocecal valve, appendiceal orifice, and                        rectum were photographed. Findings:      The perianal and digital rectal examinations were normal. Pertinent       negatives include normal sphincter tone and no palpable rectal lesions.      A few small-mouthed diverticula were found in the sigmoid colon.      A 3 mm polyp was found in the transverse colon. The polyp was sessile.       The polyp was removed with a jumbo cold forceps. Resection and retrieval       were complete.      A 10 mm polyp was found in the rectum. The polyp was sessile. The polyp       was removed with a hot snare. Resection and retrieval were complete. The       polyp was removed with a hot snare. Resection and retrieval were       complete. To prevent bleeding after the polypectomy, one hemostatic clip       was successfully placed. There was no bleeding during, or at the end, of       the procedure.      Non-bleeding internal hemorrhoids were found during retroflexion. The       hemorrhoids were Grade I (internal hemorrhoids that do not prolapse).      The exam was otherwise without abnormality. Impression:           - Diverticulosis in the sigmoid colon.                       - One 3 mm polyp in the transverse colon, removed with                        a jumbo cold forceps. Resected and retrieved.                       - One 10 mm polyp in the rectum, removed with a hot                        snare. Resected and retrieved.                       -  Non-bleeding internal hemorrhoids.                       - The examination was otherwise normal. Recommendation:       - Patient has a contact number available for                        emergencies. The signs and symptoms of potential                        delayed complications were  discussed with the patient.                        Return to normal activities tomorrow. Written discharge                        instructions were provided to the patient.                       - Resume previous diet.                       - Continue present medications.                       - Await pathology results.                       - Repeat colonoscopy is recommended for surveillance.                        The colonoscopy date will be determined after pathology                        results from today's exam become available for review.                       - The findings and recommendations were discussed with                        the patient and their family.                       - Patient has a contact number available for                        emergencies. The signs and symptoms of potential                        delayed complications were discussed with the patient.                        Return to normal activities tomorrow. Written discharge                        instructions were provided to the patient.                       - Resume previous diet.                       - Continue present medications.                       -  Await pathology results.                       - Repeat colonoscopy is recommended for surveillance.                        The colonoscopy date will be determined after pathology                        results from today's exam become available for review.                       - Return to GI office PRN.                       - The findings and recommendations were discussed with                        the patient and their spouse. Procedure Code(s):    --- Professional ---                       [phone], Colonoscopy, flexible; with removal of tumor(s),                        polyp(s), or other lesion(s) by snare technique                       45380, 48, Colonoscopy, flexible; with biopsy, single                        or  multiple Diagnosis Code(s):    --- Professional ---                       K57.30, Diverticulosis of large intestine without                        perforation or abscess without bleeding                       R19.5, Other fecal abnormalities                       K62.1, Rectal polyp                       D12.3, Benign neoplasm of transverse colon (hepatic                        flexure or splenic flexure)                       K64.0, First degree hemorrhoids CPT copyright 2016 American Medical Association. All rights reserved. The codes documented in this report are preliminary and upon coder review may  be revised to meet current compliance requirements. Efrain Sella MD, MD 03/17/2017 11:55:21 AM This report has been signed electronically. Number of Addenda: 0 Note Initiated On: 03/17/2017 11:16 AM Scope Withdrawal Time: 0 hours 11 minutes 17 seconds  Total Procedure Duration: 0 hours 16 minutes 19 seconds       [facility]

## 2017-03-18 ENCOUNTER — Encounter: Payer: Self-pay | Admitting: Internal Medicine

## 2017-03-19 LAB — SURGICAL PATHOLOGY

## 2017-04-12 ENCOUNTER — Encounter: Payer: Self-pay | Admitting: *Deleted

## 2017-04-13 ENCOUNTER — Ambulatory Visit: Payer: Medicare Other | Admitting: Registered Nurse

## 2017-04-13 ENCOUNTER — Encounter: Payer: Self-pay | Admitting: *Deleted

## 2017-04-13 ENCOUNTER — Encounter
Admission: RE | Disposition: A | Discharge status: Home / Self Care | Payer: Self-pay | Source: Ambulatory Visit | Attending: Internal Medicine

## 2017-04-13 ENCOUNTER — Ambulatory Visit
Admission: RE | Admit: 2017-04-13 | Discharge: 2017-04-13 | Disposition: A | Discharge status: Home / Self Care | Payer: Medicare Other | Source: Ambulatory Visit | Attending: Internal Medicine | Admitting: Internal Medicine

## 2017-04-13 DIAGNOSIS — I10 Essential (primary) hypertension: Secondary | ICD-10-CM | POA: Insufficient documentation

## 2017-04-13 DIAGNOSIS — Z880 Allergy status to penicillin: Secondary | ICD-10-CM | POA: Insufficient documentation

## 2017-04-13 DIAGNOSIS — J449 Chronic obstructive pulmonary disease, unspecified: Secondary | ICD-10-CM | POA: Diagnosis not present

## 2017-04-13 DIAGNOSIS — Z8673 Personal history of transient ischemic attack (TIA), and cerebral infarction without residual deficits: Secondary | ICD-10-CM | POA: Diagnosis not present

## 2017-04-13 DIAGNOSIS — Z888 Allergy status to other drugs, medicaments and biological substances status: Secondary | ICD-10-CM | POA: Insufficient documentation

## 2017-04-13 DIAGNOSIS — Z79899 Other long term (current) drug therapy: Secondary | ICD-10-CM | POA: Insufficient documentation

## 2017-04-13 DIAGNOSIS — K760 Fatty (change of) liver, not elsewhere classified: Secondary | ICD-10-CM | POA: Insufficient documentation

## 2017-04-13 DIAGNOSIS — M1990 Unspecified osteoarthritis, unspecified site: Secondary | ICD-10-CM | POA: Diagnosis not present

## 2017-04-13 DIAGNOSIS — Z91013 Allergy to seafood: Secondary | ICD-10-CM | POA: Insufficient documentation

## 2017-04-13 DIAGNOSIS — K626 Ulcer of anus and rectum: Secondary | ICD-10-CM | POA: Insufficient documentation

## 2017-04-13 DIAGNOSIS — Z85048 Personal history of other malignant neoplasm of rectum, rectosigmoid junction, and anus: Secondary | ICD-10-CM | POA: Diagnosis not present

## 2017-04-13 DIAGNOSIS — B1920 Unspecified viral hepatitis C without hepatic coma: Secondary | ICD-10-CM | POA: Diagnosis not present

## 2017-04-13 DIAGNOSIS — K740 Hepatic fibrosis: Secondary | ICD-10-CM | POA: Insufficient documentation

## 2017-04-13 DIAGNOSIS — K219 Gastro-esophageal reflux disease without esophagitis: Secondary | ICD-10-CM | POA: Insufficient documentation

## 2017-04-13 DIAGNOSIS — Z08 Encounter for follow-up examination after completed treatment for malignant neoplasm: Secondary | ICD-10-CM | POA: Insufficient documentation

## 2017-04-13 HISTORY — PX: FLEXIBLE SIGMOIDOSCOPY: SHX5431

## 2017-04-13 SURGERY — SIGMOIDOSCOPY, FLEXIBLE
Anesthesia: General

## 2017-04-13 MED ORDER — PROPOFOL 10 MG/ML IV BOLUS
INTRAVENOUS | Status: DC | PRN
  Administered 2017-04-13: 60 mg via INTRAVENOUS

## 2017-04-13 MED ORDER — PROPOFOL 500 MG/50ML IV EMUL
INTRAVENOUS | Status: DC | PRN
  Administered 2017-04-13: 140 ug/kg/min via INTRAVENOUS

## 2017-04-13 MED ORDER — SPOT INK MARKER SYRINGE KIT
PACK | SUBMUCOSAL | Status: DC | PRN
  Administered 2017-04-13: 5 mL via SUBMUCOSAL

## 2017-04-13 MED ORDER — SODIUM CHLORIDE 0.9 % IV SOLN
INTRAVENOUS | Status: DC
  Administered 2017-04-13: 1000 mL via INTRAVENOUS

## 2017-04-13 NOTE — Anesthesia Preprocedure Evaluation (Signed)
Anesthesia Evaluation  Patient identified by MRN, date of birth, ID band Patient awake    Reviewed: Allergy & Precautions, H&P , NPO status , Patient's Chart, lab work & pertinent test results  History of Anesthesia Complications Negative for: history of anesthetic complications  Airway Mallampati: III  TM Distance: <3 FB Neck ROM: limited    Dental  (+) Poor Dentition, Missing, Edentulous Upper, Edentulous Lower, Upper Dentures, Lower Dentures   Pulmonary asthma , COPD, former smoker,           Cardiovascular hypertension, (-) angina(-) Past MI and (-) DOE      Neuro/Psych CVA, Residual Symptoms negative psych ROS   GI/Hepatic GERD  Medicated and Controlled,(+) Hepatitis -, C  Endo/Other  negative endocrine ROS  Renal/GU negative Renal ROS  negative genitourinary   Musculoskeletal   Abdominal   Peds  Hematology negative hematology ROS (+)   Anesthesia Other Findings Past Medical History: No date: Arthritis No date: Asthma No date: COPD (chronic obstructive pulmonary disease) (HCC) No date: Elevated liver enzymes No date: Fatty liver No date: GERD (gastroesophageal reflux disease) No date: Hepatic fibrosis No date: Hepatitis C No date: Hypertension No date: Stroke [facility])  History reviewed. No pertinent surgical history.  BMI    Body Mass Index:  25.40 kg/m      Reproductive/Obstetrics negative OB ROS                             Anesthesia Physical  Anesthesia Plan  ASA: III  Anesthesia Plan: General   Post-op Pain Management:    Induction: Intravenous  PONV Risk Score and Plan: Propofol infusion  Airway Management Planned: Natural Airway and Nasal Cannula  Additional Equipment:   Intra-op Plan:   Post-operative Plan:   Informed Consent: I have reviewed the patients History and Physical, chart, labs and discussed the procedure including the risks, benefits and  alternatives for the proposed anesthesia with the patient or authorized representative who has indicated his/her understanding and acceptance.   Dental Advisory Given  Plan Discussed with: Anesthesiologist, CRNA and Surgeon  Anesthesia Plan Comments: (Patient consented for risks of anesthesia including but not limited to:  - adverse reactions to medications - risk of intubation if required - damage to teeth, lips or other oral mucosa - sore throat or hoarseness - Damage to heart, brain, lungs or loss of life  Patient voiced understanding.)        Anesthesia Quick Evaluation

## 2017-04-13 NOTE — Transfer of Care (Signed)
Immediate Anesthesia Transfer of Care Note  Patient: Larry Parsons  Procedure(s) Performed: FLEXIBLE SIGMOIDOSCOPY (N/A )  Patient Location: PACU  Anesthesia Type:General  Level of Consciousness: awake, alert  and oriented  Airway & Oxygen Therapy: Patient Spontanous Breathing  Post-op Assessment: Report given to RN and Post -op Vital signs reviewed and stable  Post vital signs: Reviewed and stable  Last Vitals:  Vitals:   04/13/17 0853 04/13/17 0939  BP: (!) 144/72 (!) 96/44  Pulse: (!) 59   Resp: (!) 22 12  Temp: (!) 36.2 C (!) 36.2 C  SpO2: 94% 99%    Last Pain: There were no vitals filed for this visit.       Complications: No apparent anesthesia complications

## 2017-04-13 NOTE — Anesthesia Post-op Follow-up Note (Signed)
Anesthesia QCDR form completed.        

## 2017-04-13 NOTE — Interval H&P Note (Signed)
History and Physical Interval Note:  04/13/2017 9:15 AM  Larry Parsons  has presented today for surgery, with the diagnosis of RECTAL CANCER  The various methods of treatment have been discussed with the patient and family. After consideration of risks, benefits and other options for treatment, the patient has consented to  Procedure(s): FLEXIBLE SIGMOIDOSCOPY (N/A) as a surgical intervention .  The patient's history has been reviewed, patient examined, no change in status, stable for surgery.  I have reviewed the patient's chart and labs.  Questions were answered to the patient's satisfaction.     [place], [place]

## 2017-04-13 NOTE — H&P (Signed)
Outpatient short stay form Pre-procedure 04/13/2017 9:11 AM Larry Parsons K. Alice Reichert, M.D.  Primary Physician: Pernell Dupre  Reason for visit:  Rectal cancer  History of present illness:  Patient with rectal cancer noted in polyp in polypectomy specimen from colonoscopy performed on 03/17/2017. He returns today for flexible sigmoidoscopy with identification, biopsy and possible tattooing of the polypectomy site in the rectum.    Current Facility-Administered Medications:  .  0.9 %  sodium chloride infusion, , Intravenous, Continuous, [place], Benay Pike, MD, Last Rate: 20 mL/hr at 04/13/17 0909, 1,000 mL at 04/13/17 0909  Medications Prior to Admission  Medication Sig Dispense Refill Last Dose  . allopurinol (ZYLOPRIM) 100 MG tablet Take 100 mg by mouth daily.   04/13/2017 at Unknown time  . amLODipine (NORVASC) 10 MG tablet Take 10 mg by mouth daily.   04/13/2017 at Unknown time  . budesonide-formoterol (SYMBICORT) 80-4.5 MCG/ACT inhaler Inhale 2 puffs into the lungs 2 (two) times daily.   04/13/2017 at Unknown time  . carvedilol (COREG) 25 MG tablet Take 25 mg by mouth 2 (two) times daily with a meal.   04/13/2017 at Unknown time  . febuxostat (ULORIC) 40 MG tablet Take 40 mg by mouth daily.   04/13/2017 at Unknown time  . fexofenadine (ALLEGRA) 180 MG tablet Take 180 mg by mouth daily.   04/13/2017 at Unknown time  . fluticasone (FLONASE) 50 MCG/ACT nasal spray Place into both nostrils daily.   04/13/2017 at Unknown time  . montelukast (SINGULAIR) 10 MG tablet Take 10 mg by mouth at bedtime.   04/13/2017 at Unknown time  . omeprazole (PRILOSEC) 20 MG capsule Take 20 mg by mouth daily.   04/13/2017 at Unknown time  . tamsulosin (FLOMAX) 0.4 MG CAPS capsule Take 0.4 mg by mouth daily.   04/13/2017 at Unknown time  . tiotropium (SPIRIVA) 18 MCG inhalation capsule Place 18 mcg into inhaler and inhale daily.   04/13/2017 at Unknown time  . betamethasone dipropionate (DIPROLENE) 0.05 % cream Apply topically 2 (two) times  daily.   03/16/2017 at Unknown time  . EPINEPHrine (EPIPEN 2-PAK) 0.3 mg/0.3 mL IJ SOAJ injection Inject 0.3 mLs (0.3 mg total) into the muscle once. 1 Device 0   . omega-3 acid ethyl esters (LOVAZA) 1 g capsule Take 1 g by mouth daily.   Past Week at Unknown time  . [DISCONTINUED] predniSONE (DELTASONE) 20 MG tablet Take 3 tablets (60 mg total) by mouth daily with breakfast. 21 tablet 0      Allergies  Allergen Reactions  . Lisinopril Other (See Comments)    Angioedema  . Shrimp [Shellfish Allergy] Anaphylaxis  . Penicillins      Past Medical History:  Diagnosis Date  . Arthritis   . Asthma   . COPD (chronic obstructive pulmonary disease) ([place])   . Elevated liver enzymes   . Fatty liver   . GERD (gastroesophageal reflux disease)   . Hepatic fibrosis   . Hepatitis C   . Hypertension   . Stroke [facility])    2006    Review of systems:   Negative.   Physical Exam  Gen: Alert, oriented. Appears stated age.  HEENT: [place]/AT. PERRLA. Lungs: CTA, no wheezes. CV: RR nl S1, S2. Abd: soft, benign, no masses. BS+ Ext: No edema. Pulses 2+    Planned procedures: Flexible sigmoidoscopy with MAC. The patient understands the nature of the planned procedure, indications, risks, alternatives and potential complications including but not limited to bleeding, infection, perforation, damage to internal  organs and possible oversedation/side effects from anesthesia. The patient agrees and gives consent to proceed.  Please refer to procedure notes for findings, recommendations and patient disposition/instructions.    Kieli Golladay K. Alice Reichert, M.D. Gastroenterology 04/13/2017  9:11 AM

## 2017-04-13 NOTE — Anesthesia Postprocedure Evaluation (Signed)
Anesthesia Post Note  Patient: Larry Parsons  Procedure(s) Performed: FLEXIBLE SIGMOIDOSCOPY (N/A )  Patient location during evaluation: PACU Anesthesia Type: General Level of consciousness: awake and alert and oriented Pain management: pain level controlled Vital Signs Assessment: post-procedure vital signs reviewed and stable Respiratory status: spontaneous breathing Cardiovascular status: blood pressure returned to baseline Anesthetic complications: no     Last Vitals:  Vitals:   04/13/17 0955 04/13/17 1010  BP:  (!) 147/75  Pulse: (!) 54 (!) 49  Resp: 17 18  Temp:    SpO2: 98% 99%    Last Pain:  Vitals:   04/13/17 0930  TempSrc: Tympanic                 Bandon Sherwin

## 2017-04-13 NOTE — Op Note (Signed)
[facility] Gastroenterology Patient Name: Larry Parsons Procedure Date: 04/13/2017 9:12 AM MRN: [id number] Account #: [id number] Date of Birth: May 20, 1940 Admit Type: Outpatient Age: 77 Room: [facility] ENDO ROOM 2 Gender: Male Note Status: Finalized Procedure:            Flexible Sigmoidoscopy Indications:          Follow-up of rectal cancer, Rectal cancer in polyp with                        submucosal extension without lymphovascular invasion                        s/p colonosocopy with polypectomy 03/17/2017. Providers:            Benay Pike. Alice Reichert MD, MD Referring MD:         Venetia Maxon. Elijio Miles, MD (Referring MD) Medicines:            Propofol per Anesthesia Complications:        No immediate complications. Procedure:            Pre-Anesthesia Assessment:                       - The risks and benefits of the procedure and the                        sedation options and risks were discussed with the                        patient. All questions were answered and informed                        consent was obtained.                       - Patient identification and proposed procedure were                        verified prior to the procedure by the nurse. The                        procedure was verified in the procedure room.                       - ASA Grade Assessment: III - A patient with severe                        systemic disease.                       - After reviewing the risks and benefits, the patient                        was deemed in satisfactory condition to undergo the                        procedure.                       After obtaining informed consent, the scope was passed  under direct vision. The Colonoscope was introduced                        through the anus and advanced to the the rectosigmoid                        junction. The flexible sigmoidoscopy was accomplished                        without difficulty. The  patient tolerated the procedure                        well. The quality of the bowel preparation was good. Findings:      The perianal and digital rectal examinations were normal. Pertinent       negatives include normal sphincter tone and no palpable rectal lesions.      A small post polypectomy scar was found in the mid rectum. Hemoclip       still attached adjacent to polypectomy site. Biopsies were taken with a       cold forceps for histology. Area was tattooed with an injection of 4 mL       of Spot (carbon black). Impression:           - Post-polypectomy scar in the mid rectum. Biopsied.                        Tattooed. Recommendation:       - Discharge patient to home (ambulatory).                       - Written discharge instructions were provided to the                        patient.                       - Resume previous diet - advance as tolerated to                        advance diet as tolerated.                       - Await pathology results.                       - If the pathology report is malignant, then refer to a                        surgeon in 1 week.                       - The findings and recommendations were discussed with                        the patient and their family. Procedure Code(s):    --- Professional ---                       [phone], 54, Sigmoidoscopy, flexible; with biopsy, single                        or multiple  [id number], 52, Sigmoidoscopy, flexible; with directed                        submucosal injection(s), any substance Diagnosis Code(s):    --- Professional ---                       [phone], Other specified postprocedural states                       C20, Malignant neoplasm of rectum CPT copyright 2016 American Medical Association. All rights reserved. The codes documented in this report are preliminary and upon coder review may  be revised to meet current compliance requirements. Efrain Sella MD,  MD 04/13/2017 9:40:16 AM This report has been signed electronically. Number of Addenda: 0 Note Initiated On: 04/13/2017 9:12 AM Total Procedure Duration: 0 hours 7 minutes 45 seconds       [facility]

## 2017-04-14 ENCOUNTER — Encounter: Payer: Self-pay | Admitting: Internal Medicine

## 2017-04-14 LAB — SURGICAL PATHOLOGY

## 2017-10-13 ENCOUNTER — Ambulatory Visit: Payer: Medicare Other | Admitting: Anesthesiology

## 2017-10-13 ENCOUNTER — Ambulatory Visit
Admission: RE | Admit: 2017-10-13 | Discharge: 2017-10-13 | Disposition: A | Discharge status: Home / Self Care | Payer: Medicare Other | Source: Ambulatory Visit | Attending: Internal Medicine | Admitting: Internal Medicine

## 2017-10-13 ENCOUNTER — Encounter
Admission: RE | Disposition: A | Discharge status: Home / Self Care | Payer: Self-pay | Source: Ambulatory Visit | Attending: Internal Medicine

## 2017-10-13 ENCOUNTER — Other Ambulatory Visit: Payer: Self-pay

## 2017-10-13 ENCOUNTER — Encounter: Payer: Self-pay | Admitting: Student

## 2017-10-13 DIAGNOSIS — Z888 Allergy status to other drugs, medicaments and biological substances status: Secondary | ICD-10-CM | POA: Diagnosis not present

## 2017-10-13 DIAGNOSIS — I10 Essential (primary) hypertension: Secondary | ICD-10-CM | POA: Insufficient documentation

## 2017-10-13 DIAGNOSIS — Z1211 Encounter for screening for malignant neoplasm of colon: Secondary | ICD-10-CM | POA: Diagnosis not present

## 2017-10-13 DIAGNOSIS — Z85048 Personal history of other malignant neoplasm of rectum, rectosigmoid junction, and anus: Secondary | ICD-10-CM | POA: Insufficient documentation

## 2017-10-13 DIAGNOSIS — K760 Fatty (change of) liver, not elsewhere classified: Secondary | ICD-10-CM | POA: Diagnosis not present

## 2017-10-13 DIAGNOSIS — Z91013 Allergy to seafood: Secondary | ICD-10-CM | POA: Diagnosis not present

## 2017-10-13 DIAGNOSIS — Z880 Allergy status to penicillin: Secondary | ICD-10-CM | POA: Diagnosis not present

## 2017-10-13 DIAGNOSIS — M1990 Unspecified osteoarthritis, unspecified site: Secondary | ICD-10-CM | POA: Diagnosis not present

## 2017-10-13 DIAGNOSIS — K740 Hepatic fibrosis: Secondary | ICD-10-CM | POA: Insufficient documentation

## 2017-10-13 DIAGNOSIS — Z8673 Personal history of transient ischemic attack (TIA), and cerebral infarction without residual deficits: Secondary | ICD-10-CM | POA: Insufficient documentation

## 2017-10-13 DIAGNOSIS — B1920 Unspecified viral hepatitis C without hepatic coma: Secondary | ICD-10-CM | POA: Diagnosis not present

## 2017-10-13 DIAGNOSIS — Z79899 Other long term (current) drug therapy: Secondary | ICD-10-CM | POA: Diagnosis not present

## 2017-10-13 DIAGNOSIS — J449 Chronic obstructive pulmonary disease, unspecified: Secondary | ICD-10-CM | POA: Diagnosis not present

## 2017-10-13 DIAGNOSIS — K219 Gastro-esophageal reflux disease without esophagitis: Secondary | ICD-10-CM | POA: Insufficient documentation

## 2017-10-13 DIAGNOSIS — K640 First degree hemorrhoids: Secondary | ICD-10-CM | POA: Insufficient documentation

## 2017-10-13 DIAGNOSIS — K6289 Other specified diseases of anus and rectum: Secondary | ICD-10-CM | POA: Insufficient documentation

## 2017-10-13 HISTORY — PX: FLEXIBLE SIGMOIDOSCOPY: SHX5431

## 2017-10-13 SURGERY — SIGMOIDOSCOPY, FLEXIBLE
Anesthesia: General

## 2017-10-13 MED ORDER — LIDOCAINE HCL (PF) 2 % IJ SOLN
INTRAMUSCULAR | Status: AC
  Filled 2017-10-13: qty 10

## 2017-10-13 MED ORDER — LIDOCAINE HCL (CARDIAC) PF 100 MG/5ML IV SOSY
PREFILLED_SYRINGE | INTRAVENOUS | Status: DC | PRN
  Administered 2017-10-13: 35 mg via INTRAVENOUS

## 2017-10-13 MED ORDER — SODIUM CHLORIDE 0.9 % IV SOLN
INTRAVENOUS | Status: DC
  Administered 2017-10-13: 14:00:00 via INTRAVENOUS

## 2017-10-13 MED ORDER — PROPOFOL 500 MG/50ML IV EMUL
INTRAVENOUS | Status: AC
  Filled 2017-10-13: qty 50

## 2017-10-13 MED ORDER — PROPOFOL 500 MG/50ML IV EMUL
INTRAVENOUS | Status: DC | PRN
  Administered 2017-10-13: 150 ug/kg/min via INTRAVENOUS

## 2017-10-13 MED ORDER — PROPOFOL 10 MG/ML IV BOLUS
INTRAVENOUS | Status: DC | PRN
  Administered 2017-10-13: 75 mg via INTRAVENOUS

## 2017-10-13 NOTE — Anesthesia Postprocedure Evaluation (Signed)
Anesthesia Post Note  Patient: Larry Parsons  Procedure(s) Performed: FLEXIBLE SIGMOIDOSCOPY (N/A )  Patient location during evaluation: Endoscopy Anesthesia Type: General Level of consciousness: awake and alert Pain management: pain level controlled Vital Signs Assessment: post-procedure vital signs reviewed and stable Respiratory status: spontaneous breathing, nonlabored ventilation, respiratory function stable and patient connected to nasal cannula oxygen Cardiovascular status: blood pressure returned to baseline and stable Postop Assessment: no apparent nausea or vomiting Anesthetic complications: no     Last Vitals:  Vitals:   10/13/17 1502 10/13/17 1507  BP: (!) 159/91 (!) 190/94  Pulse: 71 63  Resp: 17 15  Temp:    SpO2: 99% 95%    Last Pain:  Vitals:   10/13/17 1507  TempSrc:   PainSc: 0-No pain                 Martha Clan

## 2017-10-13 NOTE — H&P (Signed)
Outpatient short stay form Pre-procedure 10/13/2017 1:45 PM Larry Parsons, M.D.  Primary Physician: Pernell Dupre, M.D.  Reason for visit:  F/u cancerous rectal polyp  History of present illness:  Patient is a 77 y/o male presenting for f/u cancer found in a rectal polyp February 2019.  Follow-up sigmoidoscopy in March 2019 with biopsy of the polypectomy site revealed histologic findings consistent with ulcer scar without evidence of dysplastic material.  There is no evidence of cancer in the specimen.  The area was tattooed for further identification follow-up sigmoidoscopy in 6 months was then recommended.  Patient presents today for said sigmoidoscopy.  He denies any symptoms of abdominal pain rectal bleeding or change in bowel habits or weight loss.    Current Facility-Administered Medications:  .  0.9 %  sodium chloride infusion, , Intravenous, Continuous, Pump Back, Benay Pike, MD, Last Rate: 20 mL/hr at 10/13/17 1331  Medications Prior to Admission  Medication Sig Dispense Refill Last Dose  . allopurinol (ZYLOPRIM) 100 MG tablet Take 100 mg by mouth daily.   10/13/2017 at 1000  . amLODipine (NORVASC) 10 MG tablet Take 10 mg by mouth daily.   10/13/2017 at 1000  . budesonide-formoterol (SYMBICORT) 80-4.5 MCG/ACT inhaler Inhale 2 puffs into the lungs 2 (two) times daily.   10/13/2017 at 1000  . carvedilol (COREG) 25 MG tablet Take 25 mg by mouth 2 (two) times daily with a meal.   10/13/2017 at 1000  . colchicine 0.6 MG tablet Take 0.6 mg by mouth daily. Take 2 tablets at first sign of gout flare followed by 1 tablet after 1 hour. Max 1.8mg  within 1 hour   10/13/2017 at 1000  . febuxostat (ULORIC) 40 MG tablet Take 40 mg by mouth daily.   10/13/2017 at 1000  . fexofenadine (ALLEGRA) 180 MG tablet Take 180 mg by mouth daily.   10/13/2017 at 1000  . fluticasone (FLONASE) 50 MCG/ACT nasal spray Place into both nostrils daily.   10/13/2017 at 1000  . montelukast (SINGULAIR) 10 MG tablet Take 10 mg by mouth  at bedtime.   10/13/2017 at 1000  . omega-3 acid ethyl esters (LOVAZA) 1 g capsule Take 1 g by mouth daily.   10/13/2017 at 1000  . omeprazole (PRILOSEC) 20 MG capsule Take 20 mg by mouth daily.   10/13/2017 at 1000  . tamsulosin (FLOMAX) 0.4 MG CAPS capsule Take 0.4 mg by mouth daily.   10/13/2017 at 1000  . tiotropium (SPIRIVA) 18 MCG inhalation capsule Place 18 mcg into inhaler and inhale daily.   10/13/2017 at 1000  . betamethasone dipropionate (DIPROLENE) 0.05 % cream Apply topically 2 (two) times daily.   03/16/2017 at Unknown time  . EPINEPHrine (EPIPEN 2-PAK) 0.3 mg/0.3 mL IJ SOAJ injection Inject 0.3 mLs (0.3 mg total) into the muscle once. 1 Device 0      Allergies  Allergen Reactions  . Lisinopril Other (See Comments)    Angioedema  . Shrimp [Shellfish Allergy] Anaphylaxis  . Penicillins      Past Medical History:  Diagnosis Date  . Arthritis   . Asthma   . COPD (chronic obstructive pulmonary disease) ([place])   . Elevated liver enzymes   . Fatty liver   . GERD (gastroesophageal reflux disease)   . Hepatic fibrosis   . Hepatitis C   . Hypertension   . Stroke [facility])    2006    Review of systems:  Otherwise negative.    Physical Exam  Gen: Alert, oriented. Appears stated age.  HEENT: [place]/AT. PERRLA. Lungs: CTA, no wheezes. CV: RR nl S1, S2. Abd: soft, benign, no masses. BS+ Ext: No edema. Pulses 2+    Planned procedures: Proceed with flexible sigmoidoscopy. The patient understands the nature of the planned procedure, indications, risks, alternatives and potential complications including but not limited to bleeding, infection, perforation, damage to internal organs and possible oversedation/side effects from anesthesia. The patient agrees and gives consent to proceed.  Please refer to procedure notes for findings, recommendations and patient disposition/instructions.     Orlean Holtrop K. Alice Parsons, M.D. Gastroenterology 10/13/2017  1:45 PM

## 2017-10-13 NOTE — Op Note (Signed)
[facility] Gastroenterology Patient Name: Larry Parsons Procedure Date: 10/13/2017 2:21 PM MRN: [id number] Account #: [id number] Date of Birth: 07-10-1940 Admit Type: Outpatient Age: 77 Room: [facility] ENDO ROOM 1 Gender: Male Note Status: Finalized Procedure:            Flexible Sigmoidoscopy Indications:          High risk colon cancer surveillance: Personal history                        of rectal cancer Providers:            Benay Pike. Alice Reichert MD, MD Referring MD:         Venetia Maxon. Elijio Miles, MD (Referring MD) Medicines:            Propofol per Anesthesia Complications:        No immediate complications. Procedure:            Pre-Anesthesia Assessment:                       - The risks and benefits of the procedure and the                        sedation options and risks were discussed with the                        patient. All questions were answered and informed                        consent was obtained.                       - Patient identification and proposed procedure were                        verified prior to the procedure by the nurse. The                        procedure was verified in the procedure room.                       - ASA Grade Assessment: III - A patient with severe                        systemic disease.                       - After reviewing the risks and benefits, the patient                        was deemed in satisfactory condition to undergo the                        procedure.                       After obtaining informed consent, the scope was passed                        under direct vision. The Colonoscope was introduced  through the anus and advanced to the the sigmoid colon.                        The flexible sigmoidoscopy was accomplished without                        difficulty. The patient tolerated the procedure well.                        The quality of the bowel preparation was  excellent. Findings:      The perianal and digital rectal examinations were normal. Pertinent       negatives include normal sphincter tone and no palpable rectal lesions.      A tattoo was seen in the rectum. A post-polypectomy scar was found at       the tattoo site. This was biopsied with a cold forceps for histology.      A 3 mm polyp was found in the rectum. The polyp was sessile. The polyp       was removed with a cold biopsy forceps. Resection and retrieval were       complete.      Non-bleeding internal hemorrhoids were found during retroflexion. The       hemorrhoids were Grade I (internal hemorrhoids that do not prolapse). Impression:           - A tattoo was seen in the rectum. A post-polypectomy                        scar was found at the tattoo site. Biopsied.                       - One 3 mm polyp in the rectum, removed with a cold                        biopsy forceps. Resected and retrieved.                       - Non-bleeding internal hemorrhoids. Recommendation:       - Await pathology results.                       - Repeat flexible sigmoidoscopy in 1 year for                        surveillance based on personal history of colon cancer. Procedure Code(s):    --- Professional ---                       [id number], Sigmoidoscopy, flexible; with biopsy, single or                        multiple Diagnosis Code(s):    --- Professional ---                       K62.1, Rectal polyp                       K64.0, First degree hemorrhoids                       Z85.048, Personal history of other  malignant neoplasm                        of rectum, rectosigmoid junction, and anus CPT copyright 2017 American Medical Association. All rights reserved. The codes documented in this report are preliminary and upon coder review may  be revised to meet current compliance requirements. Efrain Sella MD, MD 10/13/2017 2:47:30 PM This report has been signed electronically. Number of  Addenda: 0 Note Initiated On: 10/13/2017 2:21 PM Total Procedure Duration: 0 hours 7 minutes 48 seconds       [facility]

## 2017-10-13 NOTE — Anesthesia Preprocedure Evaluation (Addendum)
Anesthesia Evaluation  Patient identified by MRN, date of birth, ID band Patient awake    Reviewed: Allergy & Precautions, H&P , NPO status , Patient's Chart, lab work & pertinent test results  Airway Mallampati: III  TM Distance: >3 FB Neck ROM: full    Dental   Pulmonary neg pulmonary ROS, asthma , COPD,  COPD inhaler,    breath sounds clear to auscultation       Cardiovascular hypertension, negative cardio ROS   Rhythm:regular Rate:Normal     Neuro/Psych CVA, No Residual Symptoms negative neurological ROS  negative psych ROS   GI/Hepatic negative GI ROS, Neg liver ROS, GERD  ,(+) Hepatitis -, C  Endo/Other  negative endocrine ROS  Renal/GU negative Renal ROS  negative genitourinary   Musculoskeletal   Abdominal   Peds  Hematology negative hematology ROS (+)   Anesthesia Other Findings Past Medical History: No date: Arthritis No date: Asthma No date: COPD (chronic obstructive pulmonary disease) (HCC) No date: Elevated liver enzymes No date: Fatty liver No date: GERD (gastroesophageal reflux disease) No date: Hepatic fibrosis No date: Hepatitis C No date: Hypertension No date: Stroke [facility])     Comment:  2006  Past Surgical History: 03/17/2017: COLONOSCOPY WITH PROPOFOL; N/A     Comment:  Procedure: COLONOSCOPY WITH PROPOFOL;  Surgeon: [place],               Benay Pike, MD;  Location: [facility] ENDOSCOPY;  Service:               Gastroenterology;  Laterality: N/A; 04/13/2017: FLEXIBLE SIGMOIDOSCOPY; N/A     Comment:  Procedure: FLEXIBLE SIGMOIDOSCOPY;  Surgeon: [place],               Benay Pike, MD;  Location: [facility] ENDOSCOPY;  Service:               Gastroenterology;  Laterality: N/A;  BMI    Body Mass Index:  25.54 kg/m      Reproductive/Obstetrics negative OB ROS                            Anesthesia Physical Anesthesia Plan  ASA: IV  Anesthesia Plan: General   Post-op Pain  Management:    Induction:   PONV Risk Score and Plan: Propofol infusion and TIVA  Airway Management Planned: Natural Airway and Nasal Cannula  Additional Equipment:   Intra-op Plan:   Post-operative Plan:   Informed Consent: I have reviewed the patients History and Physical, chart, labs and discussed the procedure including the risks, benefits and alternatives for the proposed anesthesia with the patient or authorized representative who has indicated his/her understanding and acceptance.   Dental Advisory Given  Plan Discussed with: Anesthesiologist, CRNA and Surgeon  Anesthesia Plan Comments: (Discussed hypertension with patient and recommended close F/U with PCP.  Pt denies headache, blurry vision, CP.  As propofol/native airway anesthetic planned, believe safe to proceed.  )       Anesthesia Quick Evaluation

## 2017-10-13 NOTE — Anesthesia Post-op Follow-up Note (Signed)
Anesthesia QCDR form completed.        

## 2017-10-13 NOTE — Transfer of Care (Signed)
Immediate Anesthesia Transfer of Care Note  Patient: Larry Parsons  Procedure(s) Performed: FLEXIBLE SIGMOIDOSCOPY (N/A )  Patient Location: PACU and Endoscopy Unit  Anesthesia Type:General  Level of Consciousness: awake  Airway & Oxygen Therapy: Patient Spontanous Breathing  Post-op Assessment: Report given to RN  Post vital signs: stable  Last Vitals:  Vitals Value Taken Time  BP    Temp    Pulse    Resp    SpO2      Last Pain:  Vitals:   10/13/17 1316  TempSrc: Tympanic  PainSc: 0-No pain         Complications: No apparent anesthesia complications

## 2017-10-13 NOTE — Interval H&P Note (Signed)
History and Physical Interval Note:  10/13/2017 1:47 PM  Larry Parsons  has presented today for surgery, with the diagnosis of RECTAL CANCER  The various methods of treatment have been discussed with the patient and family. After consideration of risks, benefits and other options for treatment, the patient has consented to  Procedure(s): FLEXIBLE SIGMOIDOSCOPY (N/A) as a surgical intervention .  The patient's history has been reviewed, patient examined, no change in status, stable for surgery.  I have reviewed the patient's chart and labs.  Questions were answered to the patient's satisfaction.     [place], [place]

## 2017-10-14 ENCOUNTER — Encounter: Payer: Self-pay | Admitting: Internal Medicine

## 2017-10-16 LAB — SURGICAL PATHOLOGY

## 2018-04-11 ENCOUNTER — Other Ambulatory Visit: Payer: Self-pay | Admitting: Nephrology

## 2018-04-11 ENCOUNTER — Other Ambulatory Visit (HOSPITAL_COMMUNITY): Payer: Self-pay | Admitting: Nephrology

## 2018-04-11 DIAGNOSIS — N183 Chronic kidney disease, stage 3 unspecified: Secondary | ICD-10-CM

## 2018-04-11 DIAGNOSIS — I129 Hypertensive chronic kidney disease with stage 1 through stage 4 chronic kidney disease, or unspecified chronic kidney disease: Secondary | ICD-10-CM

## 2018-05-10 ENCOUNTER — Ambulatory Visit
Admission: RE | Admit: 2018-05-10 | Discharge: 2018-05-10 | Disposition: A | Discharge status: Home / Self Care | Payer: Medicare Other | Source: Ambulatory Visit | Attending: Nephrology | Admitting: Nephrology

## 2018-05-10 ENCOUNTER — Other Ambulatory Visit: Payer: Self-pay

## 2018-05-10 DIAGNOSIS — N183 Chronic kidney disease, stage 3 unspecified: Secondary | ICD-10-CM

## 2018-05-10 DIAGNOSIS — I129 Hypertensive chronic kidney disease with stage 1 through stage 4 chronic kidney disease, or unspecified chronic kidney disease: Secondary | ICD-10-CM | POA: Diagnosis present

## 2018-11-28 ENCOUNTER — Other Ambulatory Visit
Admission: RE | Admit: 2018-11-28 | Discharge: 2018-11-28 | Disposition: A | Discharge status: Home / Self Care | Payer: Medicare Other | Source: Ambulatory Visit | Attending: Internal Medicine | Admitting: Internal Medicine

## 2018-11-28 DIAGNOSIS — Z20828 Contact with and (suspected) exposure to other viral communicable diseases: Secondary | ICD-10-CM | POA: Diagnosis not present

## 2018-11-28 DIAGNOSIS — Z01812 Encounter for preprocedural laboratory examination: Secondary | ICD-10-CM | POA: Insufficient documentation

## 2018-11-28 LAB — SARS CORONAVIRUS 2 (TAT 6-24 HRS): SARS Coronavirus 2: NEGATIVE

## 2018-11-30 ENCOUNTER — Encounter: Payer: Self-pay | Admitting: *Deleted

## 2018-12-01 ENCOUNTER — Ambulatory Visit: Payer: Medicare Other | Admitting: Anesthesiology

## 2018-12-01 ENCOUNTER — Other Ambulatory Visit: Payer: Self-pay

## 2018-12-01 ENCOUNTER — Encounter: Payer: Self-pay | Admitting: Anesthesiology

## 2018-12-01 ENCOUNTER — Encounter
Admission: RE | Disposition: A | Discharge status: Home / Self Care | Payer: Self-pay | Source: Home / Self Care | Attending: Internal Medicine

## 2018-12-01 ENCOUNTER — Ambulatory Visit
Admission: RE | Admit: 2018-12-01 | Discharge: 2018-12-01 | Disposition: A | Discharge status: Home / Self Care | Payer: Medicare Other | Attending: Internal Medicine | Admitting: Internal Medicine

## 2018-12-01 DIAGNOSIS — Z87891 Personal history of nicotine dependence: Secondary | ICD-10-CM | POA: Insufficient documentation

## 2018-12-01 DIAGNOSIS — Z8673 Personal history of transient ischemic attack (TIA), and cerebral infarction without residual deficits: Secondary | ICD-10-CM | POA: Diagnosis not present

## 2018-12-01 DIAGNOSIS — K219 Gastro-esophageal reflux disease without esophagitis: Secondary | ICD-10-CM | POA: Insufficient documentation

## 2018-12-01 DIAGNOSIS — Z9889 Other specified postprocedural states: Secondary | ICD-10-CM | POA: Insufficient documentation

## 2018-12-01 DIAGNOSIS — M1990 Unspecified osteoarthritis, unspecified site: Secondary | ICD-10-CM | POA: Diagnosis not present

## 2018-12-01 DIAGNOSIS — Z7951 Long term (current) use of inhaled steroids: Secondary | ICD-10-CM | POA: Insufficient documentation

## 2018-12-01 DIAGNOSIS — K760 Fatty (change of) liver, not elsewhere classified: Secondary | ICD-10-CM | POA: Diagnosis not present

## 2018-12-01 DIAGNOSIS — J449 Chronic obstructive pulmonary disease, unspecified: Secondary | ICD-10-CM | POA: Diagnosis not present

## 2018-12-01 DIAGNOSIS — B1920 Unspecified viral hepatitis C without hepatic coma: Secondary | ICD-10-CM | POA: Insufficient documentation

## 2018-12-01 DIAGNOSIS — Z79899 Other long term (current) drug therapy: Secondary | ICD-10-CM | POA: Diagnosis not present

## 2018-12-01 DIAGNOSIS — M109 Gout, unspecified: Secondary | ICD-10-CM | POA: Diagnosis not present

## 2018-12-01 DIAGNOSIS — K640 First degree hemorrhoids: Secondary | ICD-10-CM | POA: Insufficient documentation

## 2018-12-01 DIAGNOSIS — I10 Essential (primary) hypertension: Secondary | ICD-10-CM | POA: Diagnosis not present

## 2018-12-01 DIAGNOSIS — D122 Benign neoplasm of ascending colon: Secondary | ICD-10-CM | POA: Diagnosis not present

## 2018-12-01 DIAGNOSIS — Z1211 Encounter for screening for malignant neoplasm of colon: Secondary | ICD-10-CM | POA: Diagnosis not present

## 2018-12-01 DIAGNOSIS — Z86010 Personal history of colonic polyps: Secondary | ICD-10-CM | POA: Diagnosis not present

## 2018-12-01 DIAGNOSIS — Z85048 Personal history of other malignant neoplasm of rectum, rectosigmoid junction, and anus: Secondary | ICD-10-CM | POA: Diagnosis present

## 2018-12-01 DIAGNOSIS — Z888 Allergy status to other drugs, medicaments and biological substances status: Secondary | ICD-10-CM | POA: Insufficient documentation

## 2018-12-01 DIAGNOSIS — Z91013 Allergy to seafood: Secondary | ICD-10-CM | POA: Insufficient documentation

## 2018-12-01 DIAGNOSIS — Z880 Allergy status to penicillin: Secondary | ICD-10-CM | POA: Insufficient documentation

## 2018-12-01 DIAGNOSIS — Z87892 Personal history of anaphylaxis: Secondary | ICD-10-CM | POA: Diagnosis not present

## 2018-12-01 HISTORY — DX: Pain in unspecified joint: M25.50

## 2018-12-01 HISTORY — DX: Other fecal abnormalities: R19.5

## 2018-12-01 HISTORY — PX: COLONOSCOPY WITH PROPOFOL: SHX5780

## 2018-12-01 SURGERY — COLONOSCOPY WITH PROPOFOL
Anesthesia: General

## 2018-12-01 MED ORDER — PROPOFOL 10 MG/ML IV BOLUS
INTRAVENOUS | Status: DC | PRN
  Administered 2018-12-01: 60 mg via INTRAVENOUS

## 2018-12-01 MED ORDER — SODIUM CHLORIDE 0.9 % IV SOLN
INTRAVENOUS | Status: DC
  Administered 2018-12-01: 08:00:00 via INTRAVENOUS

## 2018-12-01 MED ORDER — PROPOFOL 500 MG/50ML IV EMUL
INTRAVENOUS | Status: DC | PRN
  Administered 2018-12-01: 125 ug/kg/min via INTRAVENOUS

## 2018-12-01 MED ORDER — LIDOCAINE HCL (CARDIAC) PF 100 MG/5ML IV SOSY
PREFILLED_SYRINGE | INTRAVENOUS | Status: DC | PRN
  Administered 2018-12-01: 100 mg via INTRAVENOUS

## 2018-12-01 MED ORDER — PROPOFOL 10 MG/ML IV BOLUS
INTRAVENOUS | Status: AC
  Filled 2018-12-01: qty 20

## 2018-12-01 MED ORDER — LIDOCAINE HCL (PF) 2 % IJ SOLN
INTRAMUSCULAR | Status: AC
  Filled 2018-12-01: qty 10

## 2018-12-01 NOTE — Op Note (Signed)
[facility] Gastroenterology Patient Name: Larry Parsons Procedure Date: 12/01/2018 7:30 AM MRN: [id number] Account #: [id number] Date of Birth: 22-Dec-1940 Admit Type: Outpatient Age: 78 Room: [facility] ENDO ROOM 1 Gender: Male Note Status: Finalized Procedure:            Colonoscopy Indications:          High risk colon cancer surveillance: Personal history                        of multiple (3 or more) adenomas, High risk colon                        cancer surveillance: Personal history of rectal cancer Providers:            Benay Pike. Alice Reichert MD, MD Referring MD:         Venetia Maxon. Elijio Miles, MD (Referring MD) Medicines:            Propofol per Anesthesia Complications:        No immediate complications. Procedure:            Pre-Anesthesia Assessment:                       - The risks and benefits of the procedure and the                        sedation options and risks were discussed with the                        patient. All questions were answered and informed                        consent was obtained.                       - Patient identification and proposed procedure were                        verified prior to the procedure by the nurse. The                        procedure was verified in the procedure room.                       - ASA Grade Assessment: III - A patient with severe                        systemic disease.                       - After reviewing the risks and benefits, the patient                        was deemed in satisfactory condition to undergo the                        procedure.                       After obtaining informed consent, the colonoscope was  passed under direct vision. Throughout the procedure,                        the patient's blood pressure, pulse, and oxygen                        saturations were monitored continuously. The                        Colonoscope was introduced through the  anus and                        advanced to the the cecum, identified by appendiceal                        orifice and ileocecal valve. The colonoscopy was                        performed without difficulty. The patient tolerated the                        procedure well. The quality of the bowel preparation                        was good. Findings:      The perianal and digital rectal examinations were normal. Pertinent       negatives include normal sphincter tone and no palpable rectal lesions.      Two semi-pedunculated polyps were found in the ascending colon. The       polyps were 8 to 10 mm in size. These polyps were removed with a hot       snare. Resection and retrieval were complete. To prevent bleeding after       the polypectomy, one hemostatic clip was successfully placed (MR       conditional). There was no bleeding during, or at the end, of the       procedure.      A 6 mm polyp was found in the ascending colon. The polyp was sessile.       The polyp was removed with a cold snare. Resection and retrieval were       complete.      A 5 mm post polypectomy scar was found in the rectum. The scar was       unremarkable in appearance. This was biopsied with a cold forceps for       histology.      A tattoo was seen in the rectum. A post-polypectomy scar was found at       the tattoo site.      Non-bleeding internal hemorrhoids were found during retroflexion. The       hemorrhoids were Grade I (internal hemorrhoids that do not prolapse).      The exam was otherwise without abnormality. Impression:           - Two 8 to 10 mm polyps in the ascending colon, removed                        with a hot snare. Resected and retrieved. Clip (MR                        conditional) was  placed.                       - One 6 mm polyp in the ascending colon, removed with a                        cold snare. Resected and retrieved.                       - Post-polypectomy scar in the  rectum. Biopsied.                       - A tattoo was seen in the rectum. A post-polypectomy                        scar was found at the tattoo site.                       - Non-bleeding internal hemorrhoids.                       - The examination was otherwise normal. Recommendation:       - Patient has a contact number available for                        emergencies. The signs and symptoms of potential                        delayed complications were discussed with the patient.                        Return to normal activities tomorrow. Written discharge                        instructions were provided to the patient.                       - Resume previous diet.                       - Continue present medications.                       - Repeat colonoscopy is recommended for surveillance.                        The colonoscopy date will be determined after pathology                        results from today's exam become available for review.                       - Return to GI office PRN.                       - The findings and recommendations were discussed with                        the patient. Procedure Code(s):    --- Professional ---                       [phone],  Colonoscopy, flexible; with removal of tumor(s),                        polyp(s), or other lesion(s) by snare technique                       45380, 59, Colonoscopy, flexible; with biopsy, single                        or multiple Diagnosis Code(s):    --- Professional ---                       K64.0, First degree hemorrhoids                       Z98.890, Other specified postprocedural states                       Z85.048, Personal history of other malignant neoplasm                        of rectum, rectosigmoid junction, and anus                       Z86.010, Personal history of colonic polyps                       K63.5, Polyp of colon CPT copyright 2019 American Medical Association. All rights  reserved. The codes documented in this report are preliminary and upon coder review may  be revised to meet current compliance requirements. Efrain Sella MD, MD 12/01/2018 9:01:32 AM This report has been signed electronically. Number of Addenda: 0 Note Initiated On: 12/01/2018 7:30 AM Scope Withdrawal Time: 0 hours 15 minutes 22 seconds  Total Procedure Duration: 0 hours 18 minutes 42 seconds  Estimated Blood Loss: Estimated blood loss: none.      [facility]

## 2018-12-01 NOTE — Anesthesia Preprocedure Evaluation (Signed)
Anesthesia Evaluation  Patient identified by MRN, date of birth, ID band Patient awake    Reviewed: Allergy & Precautions, H&P , NPO status , Patient's Chart, lab work & pertinent test results  History of Anesthesia Complications Negative for: history of anesthetic complications  Airway Mallampati: III  TM Distance: <3 FB Neck ROM: limited    Dental  (+) Poor Dentition, Missing, Edentulous Upper, Edentulous Lower, Upper Dentures, Lower Dentures   Pulmonary shortness of breath and with exertion, asthma , COPD, neg recent URI, former smoker,           Cardiovascular hypertension, (-) angina(-) Past MI and (-) DOE      Neuro/Psych CVA, Residual Symptoms negative psych ROS   GI/Hepatic GERD  Medicated and Controlled,(+) Hepatitis -, C  Endo/Other  negative endocrine ROS  Renal/GU negative Renal ROS  negative genitourinary   Musculoskeletal   Abdominal   Peds  Hematology negative hematology ROS (+)   Anesthesia Other Findings Past Medical History: No date: Arthritis No date: Asthma No date: COPD (chronic obstructive pulmonary disease) (HCC) No date: Elevated liver enzymes No date: Fatty liver No date: GERD (gastroesophageal reflux disease) No date: Hepatic fibrosis No date: Hepatitis C No date: Hypertension No date: Stroke [facility])  History reviewed. No pertinent surgical history.  BMI    Body Mass Index:  25.40 kg/m      Reproductive/Obstetrics negative OB ROS                             Anesthesia Physical  Anesthesia Plan  ASA: III  Anesthesia Plan: General   Post-op Pain Management:    Induction: Intravenous  PONV Risk Score and Plan: 2 and Propofol infusion  Airway Management Planned: Natural Airway and Nasal Cannula  Additional Equipment:   Intra-op Plan:   Post-operative Plan:   Informed Consent: I have reviewed the patients History and Physical, chart, labs and  discussed the procedure including the risks, benefits and alternatives for the proposed anesthesia with the patient or authorized representative who has indicated his/her understanding and acceptance.     Dental Advisory Given  Plan Discussed with: Anesthesiologist, CRNA and Surgeon  Anesthesia Plan Comments: (Patient consented for risks of anesthesia including but not limited to:  - adverse reactions to medications - risk of intubation if required - damage to teeth, lips or other oral mucosa - sore throat or hoarseness - Damage to heart, brain, lungs or loss of life  Patient voiced understanding.)        Anesthesia Quick Evaluation

## 2018-12-01 NOTE — Interval H&P Note (Signed)
History and Physical Interval Note:  12/01/2018 8:25 AM  Buford Dresser  has presented today for surgery, with the diagnosis of PERSONAL HX.OF RECTAL CANCER HEME+STOOLS.  The various methods of treatment have been discussed with the patient and family. After consideration of risks, benefits and other options for treatment, the patient has consented to  Procedure(s): COLONOSCOPY WITH PROPOFOL (N/A) as a surgical intervention.  The patient's history has been reviewed, patient examined, no change in status, stable for surgery.  I have reviewed the patient's chart and labs.  Questions were answered to the patient's satisfaction.     Larry Parsons, [place]

## 2018-12-01 NOTE — Anesthesia Postprocedure Evaluation (Signed)
Anesthesia Post Note  Patient: Larry Parsons  Procedure(s) Performed: COLONOSCOPY WITH PROPOFOL (N/A )  Patient location during evaluation: Endoscopy Anesthesia Type: General Level of consciousness: awake and alert Pain management: pain level controlled Vital Signs Assessment: post-procedure vital signs reviewed and stable Respiratory status: spontaneous breathing, nonlabored ventilation, respiratory function stable and patient connected to nasal cannula oxygen Cardiovascular status: blood pressure returned to baseline and stable Postop Assessment: no apparent nausea or vomiting Anesthetic complications: no     Last Vitals:  Vitals:   12/01/18 0911 12/01/18 0921  BP: 113/70 (!) 156/79  Pulse: 67 (!) 59  Resp: 19 15  Temp:    SpO2: 98% 95%    Last Pain:  Vitals:   12/01/18 0921  TempSrc:   PainSc: 0-No pain                 Martha Clan

## 2018-12-01 NOTE — Transfer of Care (Signed)
Immediate Anesthesia Transfer of Care Note  Patient: Buford Dresser  Procedure(s) Performed: COLONOSCOPY WITH PROPOFOL (N/A )  Patient Location: Endoscopy Unit  Anesthesia Type:General  Level of Consciousness: sedated  Airway & Oxygen Therapy: Patient Spontanous Breathing and Patient connected to nasal cannula oxygen  Post-op Assessment: Report given to RN and Post -op Vital signs reviewed and stable  Post vital signs: Reviewed and stable  Last Vitals:  Vitals Value Taken Time  BP 110/49 12/01/18 0901  Temp    Pulse 59 12/01/18 0902  Resp 15 12/01/18 0902  SpO2 100 % 12/01/18 0902  Vitals shown include unvalidated device data.  Last Pain:  Vitals:   12/01/18 0757  TempSrc: Tympanic  PainSc: 0-No pain         Complications: No apparent anesthesia complications

## 2018-12-01 NOTE — H&P (Signed)
Outpatient short stay form Pre-procedure 12/01/2018 8:24 AM  K. Alice Reichert, M.D.  Primary Physician: Jodi Marble, M.D.  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 Patient denies any change in bowel habits, rectal bleeding or involuntary weight loss.     Current Facility-Administered Medications:  .  0.9 %  sodium chloride infusion, , Intravenous, Continuous, [place], Benay Pike, MD, Last Rate: 20 mL/hr at 12/01/18 [id number]  Medications Prior to Admission  Medication Sig Dispense Refill Last Dose  . allopurinol (ZYLOPRIM) 100 MG tablet Take 100 mg by mouth daily.   11/30/2018 at Unknown time  . amLODipine (NORVASC) 10 MG tablet Take 10 mg by mouth daily.   12/01/2018 at Unknown time  . budesonide-formoterol (SYMBICORT) 80-4.5 MCG/ACT inhaler Inhale 2 puffs into the lungs 2 (two) times daily.   11/30/2018 at Unknown time  . carvedilol (COREG) 25 MG tablet Take 25 mg by mouth 2 (two) times daily with a meal.   12/01/2018 at Unknown time  . colchicine 0.6 MG tablet Take 0.6 mg by mouth daily. Take 2 tablets at first sign of gout flare followed by 1 tablet after 1 hour. Max 1.8mg  within 1 hour   11/30/2018 at Unknown time  . febuxostat (ULORIC) 40 MG tablet Take 40 mg by mouth daily.   11/30/2018 at Unknown time  . fexofenadine (ALLEGRA) 180 MG tablet Take 180 mg by mouth daily.   11/30/2018 at Unknown time  . fluticasone (FLONASE) 50 MCG/ACT nasal spray Place into both nostrils daily.   11/30/2018 at Unknown time  . montelukast (SINGULAIR) 10 MG tablet Take 10 mg by mouth at bedtime.   11/30/2018 at Unknown time  . omeprazole (PRILOSEC) 20 MG capsule Take 20 mg by mouth daily.   11/30/2018 at Unknown time  . tamsulosin (FLOMAX) 0.4 MG CAPS capsule Take 0.4 mg by mouth daily.   11/30/2018 at Unknown time  . tiotropium (SPIRIVA) 18 MCG inhalation capsule  Place 18 mcg into inhaler and inhale daily.   11/30/2018 at Unknown time  . betamethasone dipropionate (DIPROLENE) 0.05 % cream Apply topically 2 (two) times daily.     Marland Kitchen EPINEPHrine (EPIPEN 2-PAK) 0.3 mg/0.3 mL IJ SOAJ injection Inject 0.3 mLs (0.3 mg total) into the muscle once. (Patient not taking: Reported on 12/01/2018) 1 Device 0 Not Taking at Unknown time  . omega-3 acid ethyl esters (LOVAZA) 1 g capsule Take 1 g by mouth daily.   Not Taking at Unknown time     Allergies  Allergen Reactions  . Lisinopril Other (See Comments)    Angioedema  . Shrimp [Shellfish Allergy] Anaphylaxis  . Penicillins      Past Medical History:  Diagnosis Date  . Arthralgia   . Arthritis   . Asthma    childhood  . COPD (chronic obstructive pulmonary disease) ([place])   . Elevated liver enzymes   . Fatty liver   . GERD (gastroesophageal reflux disease)   . Hepatic fibrosis   . Hepatitis C   . Hypertension   . Positive colorectal cancer screening using Cologuard test   . Stroke [facility])    2006    Review of systems:  Otherwise negative.    Physical Exam  Gen: Alert, oriented. Appears stated age.  HEENT: [place]/AT. PERRLA. Lungs: CTA, no wheezes. CV: RR nl S1, S2. Abd: soft, benign, no masses. BS+ Ext: No edema. Pulses 2+  Planned procedures: Proceed with colonoscopy. The patient understands the nature of the planned procedure, indications, risks, alternatives and potential complications including but not limited to bleeding, infection, perforation, damage to internal organs and possible oversedation/side effects from anesthesia. The patient agrees and gives consent to proceed.  Please refer to procedure notes for findings, recommendations and patient disposition/instructions.      K. Alice Reichert, M.D. Gastroenterology 12/01/2018  8:24 AM

## 2018-12-01 NOTE — Anesthesia Post-op Follow-up Note (Signed)
Anesthesia QCDR form completed.        

## 2018-12-02 LAB — SURGICAL PATHOLOGY

## 2019-05-25 ENCOUNTER — Ambulatory Visit: Payer: Medicare Other | Attending: Internal Medicine

## 2019-05-25 DIAGNOSIS — Z23 Encounter for immunization: Secondary | ICD-10-CM

## 2019-05-25 NOTE — Progress Notes (Signed)
   Covid-19 Vaccination Clinic  Name:  Larry Parsons    MRN: [id number] DOB: Aug 26, 1940  05/25/2019  [name] was observed post Covid-19 immunization for 15 minutes without incident. He was provided with Vaccine Information Sheet and instruction to access the V-Safe system.   [name] was instructed to call 911 with any severe reactions post vaccine: Marland Kitchen Difficulty breathing  . Swelling of face and throat  . A fast heartbeat  . A bad rash all over body  . Dizziness and weakness   Immunizations Administered    Name Date Dose VIS Date Route   Pfizer COVID-19 Vaccine 05/25/2019  9:09 AM 0.3 mL 01/20/2019 Intramuscular   Manufacturer: [place]   Lot: [id number]   [place]: [id number]

## 2019-06-20 ENCOUNTER — Ambulatory Visit: Payer: Medicare Other | Attending: Internal Medicine

## 2019-06-20 DIAGNOSIS — Z23 Encounter for immunization: Secondary | ICD-10-CM

## 2019-06-20 NOTE — Progress Notes (Signed)
   Covid-19 Vaccination Clinic  Name:  Larry Parsons    MRN: [id number] DOB: [birth year]  06/20/2019  Larry Parsons was observed post Covid-19 immunization for 15 minutes without incident. He was provided with Vaccine Information Sheet and instruction to access the V-Safe system.   Larry Parsons was instructed to call 911 with any severe reactions post vaccine: Marland Kitchen Difficulty breathing  . Swelling of face and throat  . A fast heartbeat  . A bad rash all over body  . Dizziness and weakness   Immunizations Administered    Name Date Dose VIS Date Route   Pfizer COVID-19 Vaccine 06/20/2019  9:21 AM 0.3 mL 04/05/2018 Intramuscular   Manufacturer: [place]   Lot: [id number]   [place]: [id number]

## 2020-06-11 ENCOUNTER — Encounter: Payer: Self-pay | Admitting: Urology

## 2020-06-11 ENCOUNTER — Other Ambulatory Visit
Admission: RE | Admit: 2020-06-11 | Discharge: 2020-06-11 | Disposition: A | Discharge status: Home / Self Care | Payer: Medicare Other | Attending: Urology | Admitting: Urology

## 2020-06-11 ENCOUNTER — Other Ambulatory Visit: Payer: Self-pay

## 2020-06-11 ENCOUNTER — Other Ambulatory Visit: Payer: Self-pay | Admitting: *Deleted

## 2020-06-11 ENCOUNTER — Ambulatory Visit (INDEPENDENT_AMBULATORY_CARE_PROVIDER_SITE_OTHER): Payer: Medicare Other | Admitting: Urology

## 2020-06-11 VITALS — BP 191/88 | HR 48 | Ht 70.0 in | Wt 166.0 lb

## 2020-06-11 DIAGNOSIS — N400 Enlarged prostate without lower urinary tract symptoms: Secondary | ICD-10-CM | POA: Insufficient documentation

## 2020-06-11 DIAGNOSIS — N3281 Overactive bladder: Secondary | ICD-10-CM

## 2020-06-11 DIAGNOSIS — N138 Other obstructive and reflux uropathy: Secondary | ICD-10-CM | POA: Diagnosis not present

## 2020-06-11 DIAGNOSIS — N401 Enlarged prostate with lower urinary tract symptoms: Secondary | ICD-10-CM

## 2020-06-11 LAB — URINALYSIS, COMPLETE (UACMP) WITH MICROSCOPIC
Bacteria, UA: NONE SEEN
Bilirubin Urine: NEGATIVE
Glucose, UA: NEGATIVE mg/dL
Hgb urine dipstick: NEGATIVE
Leukocytes,Ua: NEGATIVE
Nitrite: NEGATIVE
Protein, ur: NEGATIVE mg/dL
Specific Gravity, Urine: 1.015 (ref 1.005–1.030)
pH: 5.5 (ref 5.0–8.0)

## 2020-06-11 LAB — BLADDER SCAN AMB NON-IMAGING

## 2020-06-11 NOTE — Patient Instructions (Addendum)
Overactive Bladder, Adult  Overactive bladder is a condition in which a person has a sudden and frequent need to urinate. A person might also leak urine if he or she cannot get to the bathroom fast enough (urinary incontinence). Sometimes, symptoms can interfere with work or social activities. What are the causes? Overactive bladder is associated with poor nerve signals between your bladder and your brain. Your bladder may get the signal to empty before it is full. You may also have very sensitive muscles that make your bladder squeeze too soon. This condition may also be caused by other factors, such as:  Medical conditions: ? Urinary tract infection. ? Infection of nearby tissues. ? Prostate enlargement. ? Bladder stones, inflammation, or tumors. ? Diabetes. ? Muscle or nerve weakness, especially from these conditions:  A spinal cord injury.  Stroke.  Multiple sclerosis.  Parkinson's disease.  Other causes: ? Surgery on the uterus or urethra. ? Drinking too much caffeine or alcohol. ? Certain medicines, especially those that eliminate extra fluid in the body (diuretics). ? Constipation. What increases the risk? You may be at greater risk for overactive bladder if you:  Are an older adult.  Smoke.  Are going through menopause.  Have prostate problems.  Have a neurological disease, such as stroke, dementia, Parkinson's disease, or multiple sclerosis (MS).  Eat or drink alcohol, spicy food, caffeine, and other things that irritate the bladder.  Are overweight or obese. What are the signs or symptoms? Symptoms of this condition include a sudden, strong urge to urinate. Other symptoms include:  Leaking urine.  Urinating 8 or more times a day.  Waking up to urinate 2 or more times overnight. How is this diagnosed? This condition may be diagnosed based on:  Your symptoms and medical history.  A physical exam.  Blood or urine tests to check for possible causes,  such as infection. You may also need to see a health care provider who specializes in urinary tract problems. This is called a urologist. How is this treated? Treatment for overactive bladder depends on the cause of your condition and whether it is mild or severe. Treatment may include:  Bladder training, such as: ? Learning to control the urge to urinate by following a schedule to urinate at regular intervals. ? Doing Kegel exercises to strengthen the pelvic floor muscles that support your bladder.  Special devices, such as: ? Biofeedback. This uses sensors to help you become aware of your body's signals. ? Electrical stimulation. This uses electrodes placed inside the body (implanted) or outside the body. These electrodes send gentle pulses of electricity to strengthen the nerves or muscles that control the bladder. ? Women may use a plastic device, called a pessary, that fits into the vagina and supports the bladder.  Medicines, such as: ? Antibiotics to treat bladder infection. ? Antispasmodics to stop the bladder from releasing urine at the wrong time. ? Tricyclic antidepressants to relax bladder muscles. ? Injections of botulinum toxin type A directly into the bladder tissue to relax bladder muscles.  Surgery, such as: ? A device may be implanted to help manage the nerve signals that control urination. ? An electrode may be implanted to stimulate electrical signals in the bladder. ? A procedure may be done to change the shape of the bladder. This is done only in very severe cases. Follow these instructions at home: Eating and drinking  Make diet or lifestyle changes recommended by your health care provider. These may include: ? Drinking fluids   throughout the day and not only with meals. ? Cutting down on caffeine or alcohol. ? Eating a healthy and balanced diet to prevent constipation. This may include:  Choosing foods that are high in fiber, such as beans, whole grains, and  fresh fruits and vegetables.  Limiting foods that are high in fat and processed sugars, such as fried and sweet foods.   Lifestyle  Lose weight if needed.  Do not use any products that contain nicotine or tobacco. These include cigarettes, chewing tobacco, and vaping devices, such as e-cigarettes. If you need help quitting, ask your health care provider.   General instructions  Take over-the-counter and prescription medicines only as told by your health care provider.  If you were prescribed an antibiotic medicine, take it as told by your health care provider. Do not stop taking the antibiotic even if you start to feel better.  Use any implants or pessary as told by your health care provider.  If needed, wear pads to absorb urine leakage.  Keep a log to track how much and when you drink, and when you need to urinate. This will help your health care provider monitor your condition.  Keep all follow-up visits. This is important. Contact a health care provider if:  You have a fever or chills.  Your symptoms do not get better with treatment.  Your pain and discomfort get worse.  You have more frequent urges to urinate. Get help right away if:  You are not able to control your bladder. Summary  Overactive bladder refers to a condition in which a person has a sudden and frequent need to urinate.  Several conditions may lead to an overactive bladder.  Treatment for overactive bladder depends on the cause and severity of your condition.  Making lifestyle changes, doing Kegel exercises, keeping a log, and taking medicines can help with this condition. This information is not intended to replace advice given to you by your health care provider. Make sure you discuss any questions you have with your health care provider. Document Revised: 10/16/2019 Document Reviewed: 10/16/2019 Elsevier Patient Education  2021 Elsevier Inc.   Benign Prostatic Hyperplasia  Benign prostatic  hyperplasia (BPH) is an enlarged prostate gland that is caused by the normal aging process and not by cancer. The prostate is a walnut-sized gland that is involved in the production of semen. It is located in front of the rectum and below the bladder. The bladder stores urine and the urethra is the tube that carries the urine out of the body. The prostate may get bigger as a man gets older. An enlarged prostate can press on the urethra. This can make it harder to pass urine. The build-up of urine in the bladder can cause infection. Back pressure and infection may progress to bladder damage and kidney (renal) failure. What are the causes? This condition is part of a normal aging process. However, not all men develop problems from this condition. If the prostate enlarges away from the urethra, urine flow will not be blocked. If it enlarges toward the urethra and compresses it, there will be problems passing urine. What increases the risk? This condition is more likely to develop in men over the age of 50 years. What are the signs or symptoms? Symptoms of this condition include:  Getting up often during the night to urinate.  Needing to urinate frequently during the day.  Difficulty starting urine flow.  Decrease in size and strength of your urine stream.  Leaking (  dribbling) after urinating.  Inability to pass urine. This needs immediate treatment.  Inability to completely empty your bladder.  Pain when you pass urine. This is more common if there is also an infection.  Urinary tract infection (UTI). How is this diagnosed? This condition is diagnosed based on your medical history, a physical exam, and your symptoms. Tests will also be done, such as:  A post-void bladder scan. This measures any amount of urine that may remain in your bladder after you finish urinating.  A digital rectal exam. In a rectal exam, your health care provider checks your prostate by putting a lubricated, gloved  finger into your rectum to feel the back of your prostate gland. This exam detects the size of your gland and any abnormal lumps or growths.  An exam of your urine (urinalysis).  A prostate specific antigen (PSA) screening. This is a blood test used to screen for prostate cancer.  An ultrasound. This test uses sound waves to electronically produce a picture of your prostate gland. Your health care provider may refer you to a specialist in kidney and prostate diseases (urologist). How is this treated? Once symptoms begin, your health care provider will monitor your condition (active surveillance or watchful waiting). Treatment for this condition will depend on the severity of your condition. Treatment may include:  Observation and yearly exams. This may be the only treatment needed if your condition and symptoms are mild.  Medicines to relieve your symptoms, including: ? Medicines to shrink the prostate. ? Medicines to relax the muscle of the prostate.  Surgery in severe cases. Surgery may include: ? Prostatectomy. In this procedure, the prostate tissue is removed completely through an open incision or with a laparoscope or robotics. ? Transurethral resection of the prostate (TURP). In this procedure, a tool is inserted through the opening at the tip of the penis (urethra). It is used to cut away tissue of the inner core of the prostate. The pieces are removed through the same opening of the penis. This removes the blockage. ? Transurethral incision (TUIP). In this procedure, small cuts are made in the prostate. This lessens the prostate's pressure on the urethra. ? Transurethral microwave thermotherapy (TUMT). This procedure uses microwaves to create heat. The heat destroys and removes a small amount of prostate tissue. ? Transurethral needle ablation (TUNA). This procedure uses radio frequencies to destroy and remove a small amount of prostate tissue. ? Interstitial laser coagulation (ILC).  This procedure uses a laser to destroy and remove a small amount of prostate tissue. ? Transurethral electrovaporization (TUVP). This procedure uses electrodes to destroy and remove a small amount of prostate tissue. ? Prostatic urethral lift. This procedure inserts an implant to push the lobes of the prostate away from the urethra. Follow these instructions at home:  Take over-the-counter and prescription medicines only as told by your health care provider.  Monitor your symptoms for any changes. Contact your health care provider with any changes.  Avoid drinking large amounts of liquid before going to bed or out in public.  Avoid or reduce how much caffeine or alcohol you drink.  Give yourself time when you urinate.  Keep all follow-up visits as told by your health care provider. This is important. Contact a health care provider if:  You have unexplained back pain.  Your symptoms do not get better with treatment.  You develop side effects from the medicine you are taking.  Your urine becomes very dark or has a bad smell.    Your lower abdomen becomes distended and you have trouble passing your urine. Get help right away if:  You have a fever or chills.  You suddenly cannot urinate.  You feel lightheaded, or very dizzy, or you faint.  There are large amounts of blood or clots in the urine.  Your urinary problems become hard to manage.  You develop moderate to severe low back or flank pain. The flank is the side of your body between the ribs and the hip. These symptoms may represent a serious problem that is an emergency. Do not wait to see if the symptoms will go away. Get medical help right away. Call your local emergency services (911 in the [place] Do not drive yourself to the hospital. Summary  Benign prostatic hyperplasia (BPH) is an enlarged prostate that is caused by the normal aging process and not by cancer.  An enlarged prostate can press on the urethra. This can  make it hard to pass urine.  This condition is part of a normal aging process and is more likely to develop in men over the age of 50 years.  Get help right away if you suddenly cannot urinate. This information is not intended to replace advice given to you by your health care provider. Make sure you discuss any questions you have with your health care provider. Document Revised: 10/05/2019 Document Reviewed: 10/05/2019 Elsevier Patient Education  2021 Elsevier Inc.   

## 2020-06-11 NOTE — Progress Notes (Signed)
06/11/20 1:57 PM   Larry Parsons 09-21-1940 [id number]  CC: Enlarged prostate, urinary frequency/urgency  HPI: I saw Larry Parsons for the above issues.  He is an 80 year old male who has been on Flomax long-term for urinary symptoms.  He recently underwent a CT, and radiology commented on an enlarged prostate and thickened bladder.  His primary urinary complaint is urinary urgency and frequency.  He does well overnight and only gets up 0-1 time.  He denies any gross hematuria, UTIs, or history of retention.  He does have a history of stroke.  He drinks primarily water during the day.  Urinalysis today is completely benign.  No prior PSA values to review.  PVR is normal today at 120 mL.  PMH: Past Medical History:  Diagnosis Date  . Arthralgia   . Arthritis   . Asthma    childhood  . COPD (chronic obstructive pulmonary disease) ([place])   . Elevated liver enzymes   . Fatty liver   . GERD (gastroesophageal reflux disease)   . Hepatic fibrosis   . Hepatitis C   . Hypertension   . Positive colorectal cancer screening using Cologuard test   . Stroke [facility])    2006    Surgical History: Past Surgical History:  Procedure Laterality Date  . COLONOSCOPY WITH PROPOFOL N/A 03/17/2017   Procedure: COLONOSCOPY WITH PROPOFOL;  Surgeon: Toledo, Benay Pike, MD;  Location: [facility] ENDOSCOPY;  Service: Gastroenterology;  Laterality: N/A;  . COLONOSCOPY WITH PROPOFOL N/A 12/01/2018   Procedure: COLONOSCOPY WITH PROPOFOL;  Surgeon: Toledo, Benay Pike, MD;  Location: [facility] ENDOSCOPY;  Service: Gastroenterology;  Laterality: N/A;  . FLEXIBLE SIGMOIDOSCOPY N/A 04/13/2017   Procedure: FLEXIBLE SIGMOIDOSCOPY;  Surgeon: Toledo, Benay Pike, MD;  Location: [facility] ENDOSCOPY;  Service: Gastroenterology;  Laterality: N/A;  . FLEXIBLE SIGMOIDOSCOPY N/A 10/13/2017   Procedure: FLEXIBLE SIGMOIDOSCOPY;  Surgeon: Toledo, Benay Pike, MD;  Location: [facility] ENDOSCOPY;  Service: Gastroenterology;  Laterality: N/A;   Family  History: Family History  Problem Relation Age of Onset  . Heart attack Father   . Heart failure Father     Social History:  reports that he has quit smoking. His smokeless tobacco use includes snuff. He reports current alcohol use of about 4.0 standard drinks of alcohol per week. He reports that he does not use drugs.  Physical Exam: BP (!) 191/88   Pulse (!) 48   Ht 5\' 10"  (1.778 m)   Wt 166 lb (75.3 kg)   BMI 23.82 kg/m    Constitutional:  Alert and oriented, No acute distress. Cardiovascular: No clubbing, cyanosis, or edema. Respiratory: Normal respiratory effort, no increased work of breathing. GI: Abdomen is soft, nontender, nondistended, no abdominal masses  Laboratory Data: Reviewed, see HPI  Pertinent Imaging: CT shows an enlarged prostate measuring 5 cm and thickened bladder, no hydronephrosis.  CT unavailable to personally review.  Assessment & Plan:   80 year old male referred for an enlarged prostate seen on his CT scan.  His primary clinical complaints are urinary urgency and frequency, that are more consistent with overactive bladder.  We discussed that an enlarged prostate in and of itself is not an indication for treatment or surgery, and that patients can have mixed symptoms of BPH and overactive bladder.  He recently had a fall resulting in a laceration and ER visit, and he would not be a good candidate for anticholinergics.  I recommended a trial of Myrbetriq 50 mg daily for his OAB symptoms.  Trial of Myrbetriq 50 mg  daily for OAB symptoms RTC 1 month symptom check Consider cystoscopy in the future if worsening urinary symptoms   Nickolas Madrid, MD 06/11/2020  [facility] [address], [place], [place] [address] [phone]

## 2020-07-16 ENCOUNTER — Ambulatory Visit: Payer: Medicare Other | Admitting: Urology

## 2020-08-20 IMAGING — US RENAL/URINARY TRACT ULTRASOUND
1 series · 14 of 25 positions shown · non-contrast
Comparison: None.

CLINICAL DATA: Chronic kidney disease stage 3.

EXAM:
RENAL / URINARY TRACT ULTRASOUND COMPLETE

[Series 1: renal/urinary tract ultrasound · 14 of 52 slices shown]
[im 1/52]
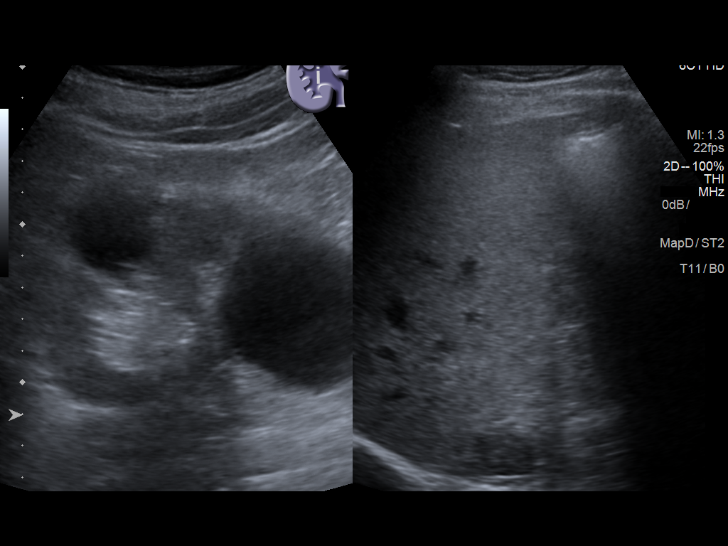
[im 5/52]
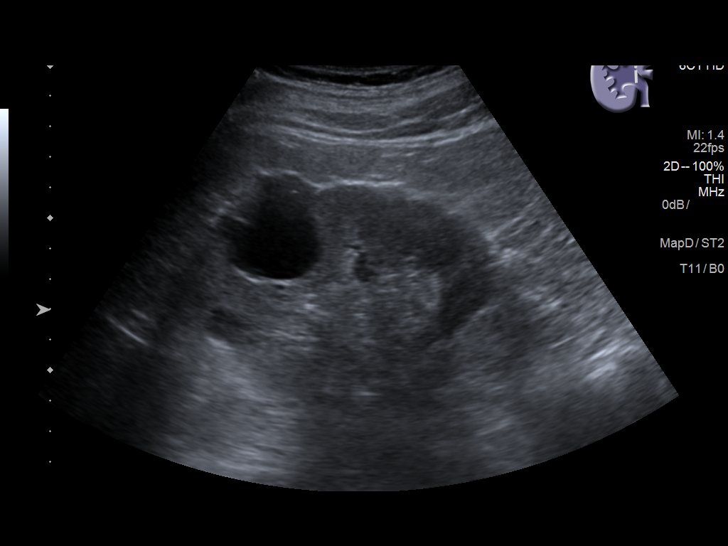
[im 9/52]
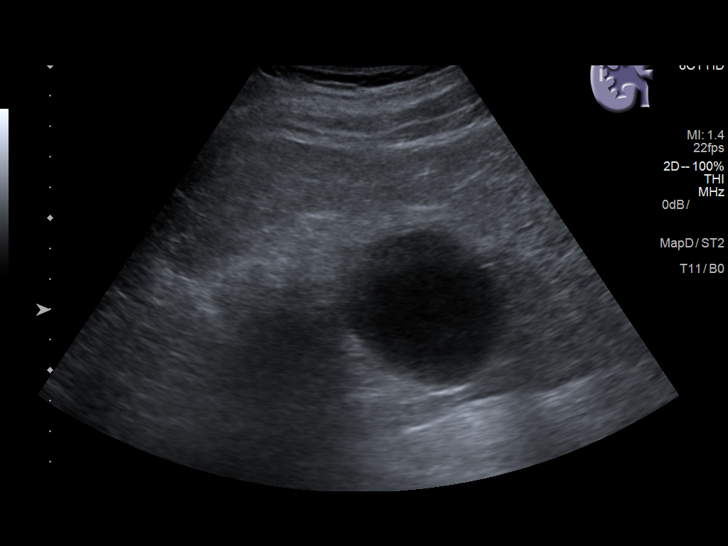
[im 13/52]
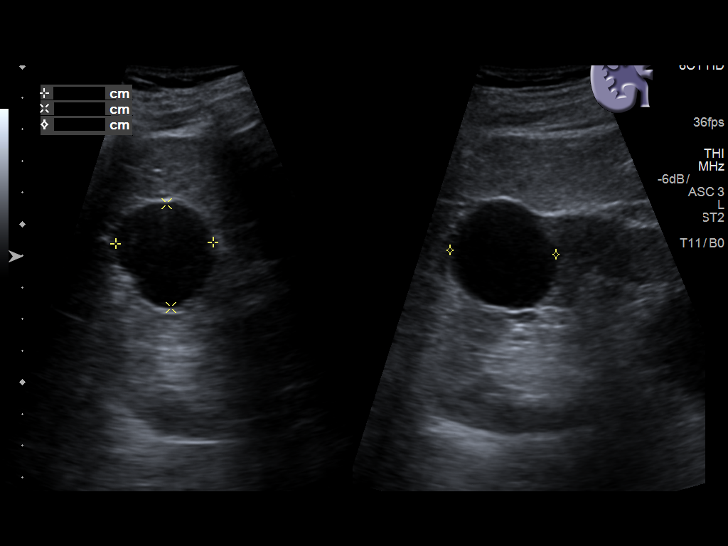
[im 18/52]
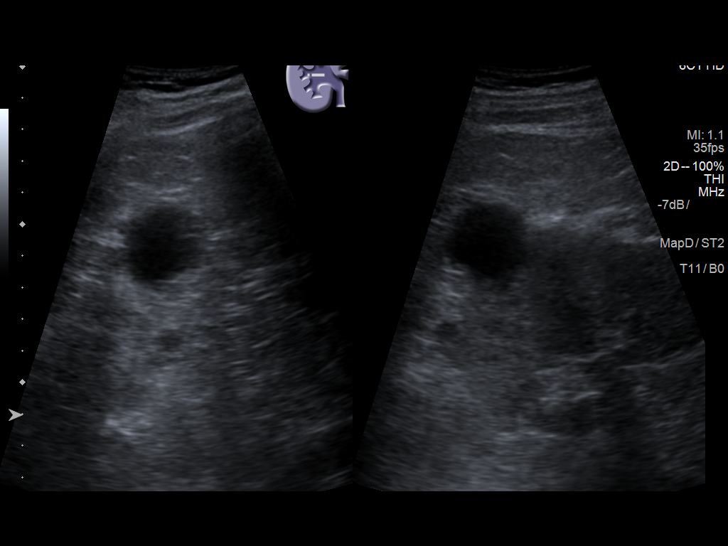
[im 20/52]
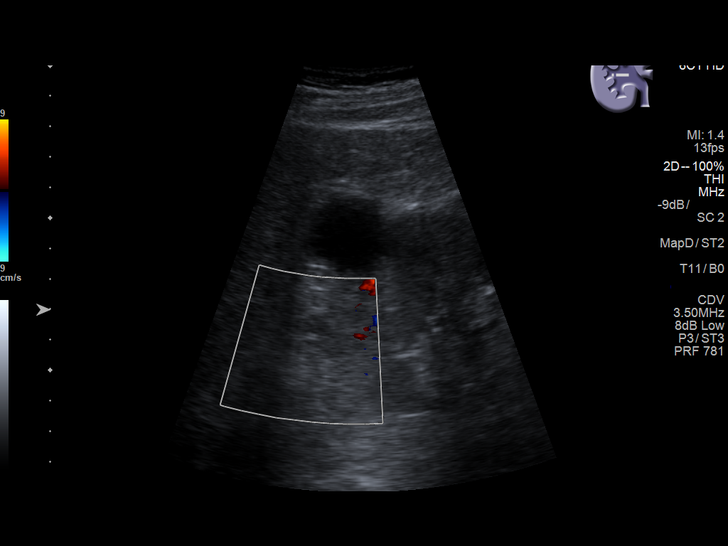
[im 24/52]
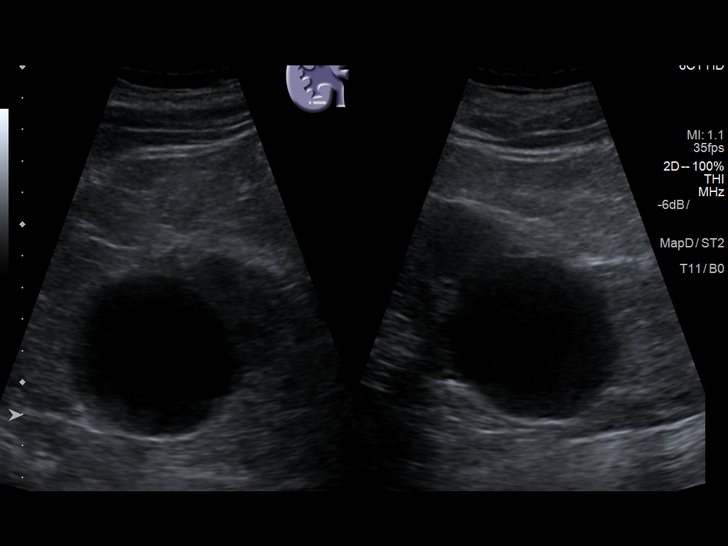
[im 28/52]
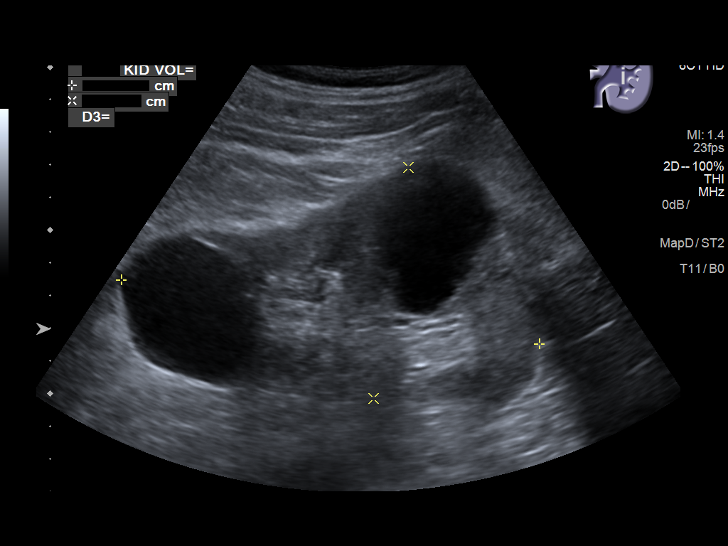
[im 32/52]
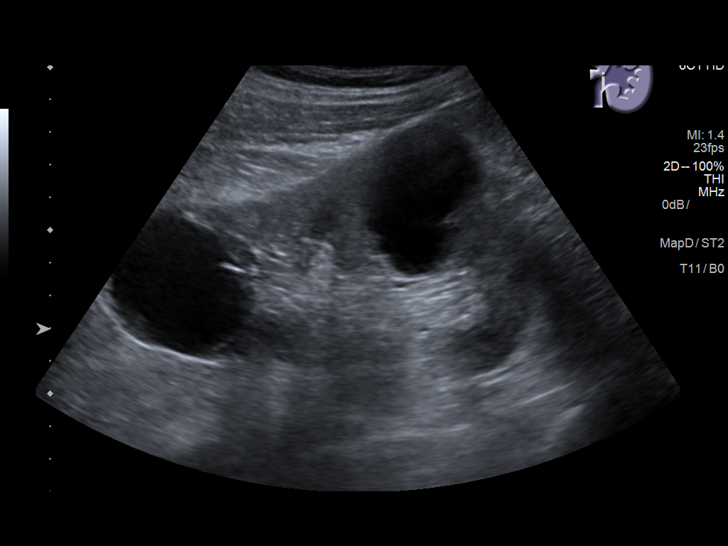
[im 35/52]
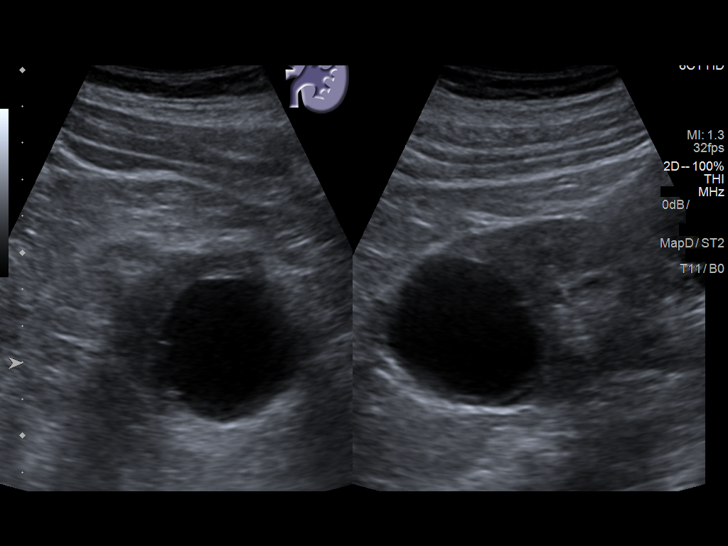
[im 39/52]
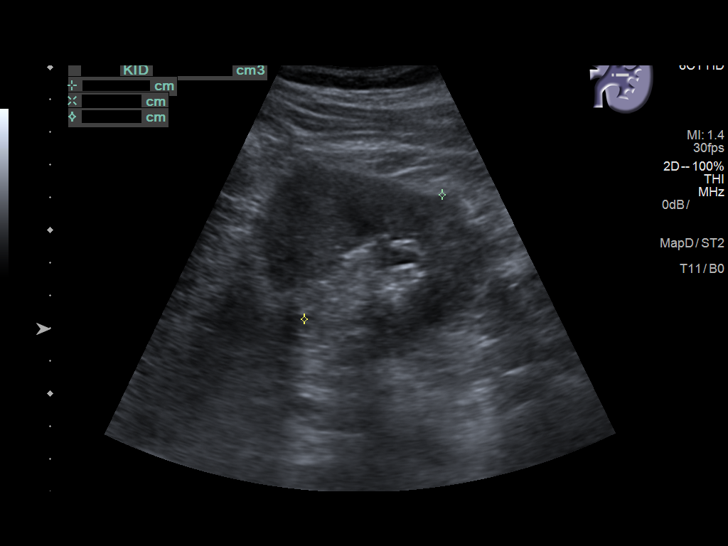
[im 43/52]
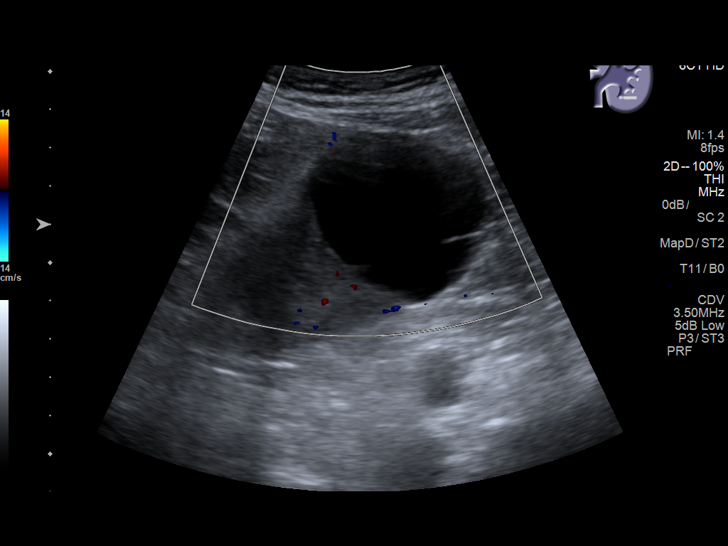
[im 47/52]
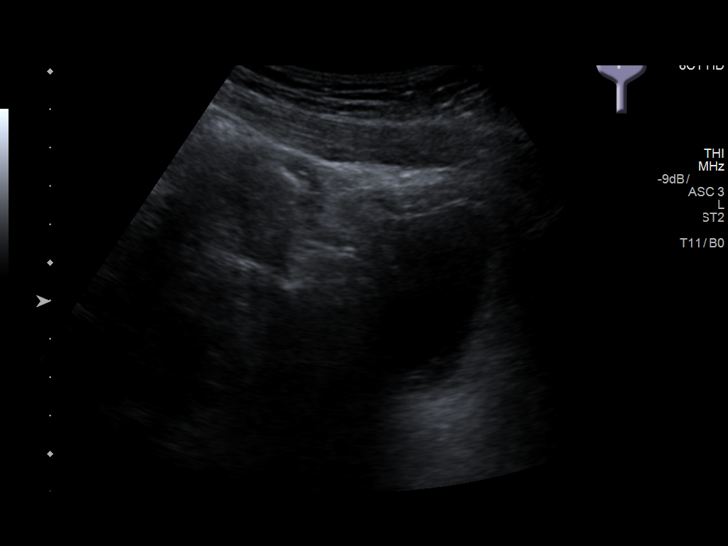
[im 52/52]
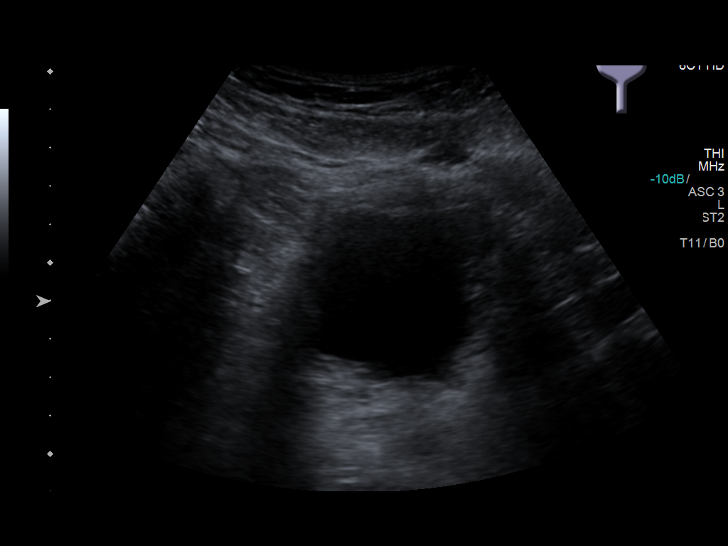

[14 of 25 positions shown; findings below may reference images not displayed]

FINDINGS: Right Kidney:

Renal measurements: 10.5 x 6.3 x 5.2 cm = volume: 181 mL. Two simple
cysts are noted, with the largest measuring 5.3 cm in the lower
pole. Increased echogenicity of renal parenchyma is noted. No mass
or hydronephrosis visualized.

Left Kidney:

Renal measurements: 12.9 x 7.2 x 5.7 cm = volume: 275 mL. 4 cm
simple cyst is seen in upper pole. 4.5 cm cyst with single septation
is seen in lower pole. Increased echogenicity of renal parenchyma is
noted. No mass or hydronephrosis visualized.

Bladder:

Appears normal for degree of bladder distention.
IMPRESSION: Increased echogenicity of renal parenchyma is noted bilaterally
consistent with medical renal disease. Bilateral simple renal cysts
are noted. 4.5 cm septated cyst is seen in lower pole of left kidney
consistent with Bosniak type 2 lesion. Follow-up ultrasound in 12
months is recommended to ensure stability. No hydronephrosis or
renal obstruction is noted.

## 2021-03-06 ENCOUNTER — Other Ambulatory Visit: Payer: Self-pay

## 2021-03-06 ENCOUNTER — Ambulatory Visit (INDEPENDENT_AMBULATORY_CARE_PROVIDER_SITE_OTHER): Payer: Medicare Other | Admitting: Dermatology

## 2021-03-06 DIAGNOSIS — L819 Disorder of pigmentation, unspecified: Secondary | ICD-10-CM

## 2021-03-06 DIAGNOSIS — L308 Other specified dermatitis: Secondary | ICD-10-CM

## 2021-03-06 DIAGNOSIS — R21 Rash and other nonspecific skin eruption: Secondary | ICD-10-CM

## 2021-03-06 MED ORDER — TRIAMCINOLONE ACETONIDE 0.1 % EX CREA: 1.0000 "application " | TOPICAL_CREAM | CUTANEOUS | 1 refills | Status: DC

## 2021-03-06 NOTE — Patient Instructions (Addendum)

## 2021-03-06 NOTE — Progress Notes (Signed)
° °  New Patient Visit  Subjective  Larry Parsons is a 81 y.o. male who presents for the following: breaking out (Hands, trunk, legs,arms, feet, 58m, itchy, otc anti itch lotion, used diprolene cr in past and helped).  The following portions of the chart were reviewed this encounter and updated as appropriate:   Tobacco   Allergies   Meds   Problems   Med Hx   Surg Hx   Fam Hx      Review of Systems:  No other skin or systemic complaints except as noted in HPI or Assessment and Plan.  Objective  Well appearing patient in no apparent distress; mood and affect are within normal limits.  A focused examination was performed including arms, legs, trunk. Relevant physical exam findings are noted in the Assessment and Plan.  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  Topical steroids (such as triamcinolone, fluocinolone, fluocinonide, mometasone, clobetasol, halobetasol, betamethasone, hydrocortisone) can cause thinning and lightening of the skin if they are used for too long in the same area. Your physician has selected the right strength medicine for your problem and area affected on the body. Please use your medication only as directed by your physician to prevent side effects.    Skin / nail biopsy - L arm Type of biopsy: punch   Informed consent: discussed and consent obtained   Timeout: patient name, date of birth, surgical site, and procedure verified   Procedure prep:  Patient was prepped and draped in usual sterile fashion (The patient was cleaned and prepped) Prep type:  Isopropyl  alcohol Anesthesia: the lesion was anesthetized in a standard fashion   Anesthetic:  1% lidocaine w/ epinephrine 1-100,000 buffered w/ 8.4% NaHCO3 Punch size:  3 mm Suture size:  4-0 Suture type: nylon   Hemostasis achieved with: suture, pressure and aluminum chloride   Outcome: patient tolerated procedure well   Post-procedure details: sterile dressing applied and wound care instructions given   Dressing type: bandage, pressure dressing and petrolatum    triamcinolone cream (KENALOG) 0.1 % - L arm Apply 1 application topically as directed. Qd to bid aa rash on body until clear, avoid face, groin, axilla  Specimen 1 - Surgical pathology Differential Diagnosis: D48.5 Eczema vs other  Check Margins: No Almost confluent patchy scaliness with lichenification and hyperpigmentation and some crust and excoriations, trunk, extremities  Return in about 1 week (around 03/13/2021) for bx f/u.  I, Othelia Pulling, RMA, am acting as scribe for Sarina Ser, MD . Documentation: I have reviewed the above documentation for accuracy and completeness, and I agree with the above.  Sarina Ser, MD

## 2021-03-10 ENCOUNTER — Encounter: Payer: Self-pay | Admitting: Dermatology

## 2021-03-13 ENCOUNTER — Ambulatory Visit (INDEPENDENT_AMBULATORY_CARE_PROVIDER_SITE_OTHER): Payer: Medicare Other | Admitting: Dermatology

## 2021-03-13 ENCOUNTER — Other Ambulatory Visit: Payer: Self-pay

## 2021-03-13 DIAGNOSIS — Z79899 Other long term (current) drug therapy: Secondary | ICD-10-CM | POA: Diagnosis not present

## 2021-03-13 DIAGNOSIS — L2081 Atopic neurodermatitis: Secondary | ICD-10-CM | POA: Diagnosis not present

## 2021-03-13 MED ORDER — DUPIXENT 300 MG/2ML ~~LOC~~ SOSY: 300.0000 mg | PREFILLED_SYRINGE | SUBCUTANEOUS | 6 refills | Status: DC

## 2021-03-13 MED ORDER — DUPILUMAB 300 MG/2ML ~~LOC~~ SOSY
600.0000 mg | PREFILLED_SYRINGE | Freq: Once | SUBCUTANEOUS | Status: AC
  Administered 2021-03-13: 600 mg via SUBCUTANEOUS

## 2021-03-13 NOTE — Patient Instructions (Signed)

## 2021-03-13 NOTE — Progress Notes (Signed)
Follow-Up Visit   Subjective  Larry Parsons is a 81 y.o. male who presents for the following: Spongiotic dermatitis c/w eczema bx proven (All over body, bx L arm, pt presents for suture removal and bx f/u, TMC 0.1% cr bid).   The following portions of the chart were reviewed this encounter and updated as appropriate:   Tobacco   Allergies   Meds   Problems   Med Hx   Surg Hx   Fam Hx      Review of Systems:  No other skin or systemic complaints except as noted in HPI or Assessment and Plan.  Objective  Well appearing patient in no apparent distress; mood and affect are within normal limits.  A focused examination was performed including arms, legs, trunk. Relevant physical exam findings are noted in the Assessment and Plan.  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 Condition is symptomatic / bothersome to patient.   Encounter for Removal of Sutures - Incision site at the L arm is clean, dry and intact - Wound cleansed, sutures removed, wound cleansed and steri strips applied.  - Discussed pathology results showing spongiotic dermatitis c/w eczema  - Patient advised to keep steri-strips dry until they fall off. - Scars remodel for a full year. - Once steri-strips fall off, patient can apply over-the-counter silicone scar cream each night to help with scar remodeling if desired. - Patient advised to call with any concerns or if they notice any new or changing lesions.   Atopic dermatitis (eczema) is a chronic, relapsing, pruritic condition that can significantly affect quality of life. It is often associated with  allergic rhinitis and/or asthma and can require treatment with topical medications, phototherapy, or in severe cases biologic injectable medication (Dupixent; Adbry) or Oral JAK inhibitors.   Discussed Dupixent  Start Dupixent 300mg /69ml sq injections q 2 wks Dupixent 300mg /71ml sq injections today to L and R upper arms, Lot [id number], exp 02/2023 Cont TMC 0.1% cr qd/bid aa eczema, avoid f/g/a  Dupilumab (Dupixent) is a treatment given by injection for adults and children with moderate-to-severe atopic dermatitis. Goal is control of skin condition, not cure. It is given as 2 injections at the first dose followed by 1 injection ever 2 weeks thereafter.  Young children are dosed monthly.  Potential side effects include allergic reaction, herpes infections, injection site reactions and conjunctivitis (inflammation of the eyes).  The use of Dupixent requires long term medication management, including periodic office visits.   Topical steroids (such as triamcinolone, fluocinolone, fluocinonide, mometasone, clobetasol, halobetasol, betamethasone, hydrocortisone) can cause thinning and lightening of the skin if they are used for too long in the same area. Your physician has selected the right strength medicine for your problem and area affected on the body. Please use your medication only as directed by your physician to prevent side effects.    dupilumab (DUPIXENT) 300 MG/2ML prefilled syringe - arms, legs, trunk Inject 300 mg into the skin every 14 (fourteen) days. Starting at day 15 for maintenance.  dupilumab (DUPIXENT) prefilled syringe 600 mg - arms, legs, trunk  Return in about 2 weeks (around 03/27/2021) for Atopic Derm, dupixent.  I, Othelia Pulling, RMA, am acting as scribe for [employer]  Nehemiah Massed, MD . Documentation: I have reviewed the above documentation for accuracy and completeness, and I agree with the above.  Sarina Ser, MD

## 2021-03-16 ENCOUNTER — Encounter: Payer: Self-pay | Admitting: Dermatology

## 2021-03-27 ENCOUNTER — Other Ambulatory Visit: Payer: Self-pay

## 2021-03-27 ENCOUNTER — Ambulatory Visit (INDEPENDENT_AMBULATORY_CARE_PROVIDER_SITE_OTHER): Payer: Medicare Other | Admitting: Dermatology

## 2021-03-27 DIAGNOSIS — L2081 Atopic neurodermatitis: Secondary | ICD-10-CM | POA: Diagnosis not present

## 2021-03-27 DIAGNOSIS — L280 Lichen simplex chronicus: Secondary | ICD-10-CM

## 2021-03-27 DIAGNOSIS — L819 Disorder of pigmentation, unspecified: Secondary | ICD-10-CM | POA: Diagnosis not present

## 2021-03-27 DIAGNOSIS — Z79899 Other long term (current) drug therapy: Secondary | ICD-10-CM

## 2021-03-27 MED ORDER — DUPILUMAB 300 MG/2ML ~~LOC~~ SOSY
300.0000 mg | PREFILLED_SYRINGE | Freq: Once | SUBCUTANEOUS | Status: AC
  Administered 2021-03-27: 300 mg via SUBCUTANEOUS

## 2021-03-27 MED ORDER — TRIAMCINOLONE ACETONIDE 0.1 % EX CREA: 1.0000 "application " | TOPICAL_CREAM | CUTANEOUS | 1 refills | Status: DC

## 2021-03-27 NOTE — Progress Notes (Signed)
° °  Follow-Up Visit   Subjective  Larry Parsons is a 81 y.o. male who presents for the following: Dermatitis (Trunk, extremities, 2 wk f/u, Dupixent started 2 wks ago, TMC 0.1% cr bid, pt feels he has improved some since starting Dupixent.).  The following portions of the chart were reviewed this encounter and updated as appropriate:   Tobacco   Allergies   Meds   Problems   Med Hx   Surg Hx   Fam Hx      Review of Systems:  No other skin or systemic complaints except as noted in HPI or Assessment and Plan.  Objective  Well appearing patient in no apparent distress; mood and affect are within normal limits.  A focused examination was performed including trunk, arms. Relevant physical exam findings are noted in the Assessment and Plan.   Assessment & Plan  Atopic neurodermatitis arms, trunk, legs -severe and generalized with dyschromia and lichenification and not responding to topical steroids Biopsy-proven eczema Chronic and persistent condition with duration or expected duration over one year. Condition is symptomatic / bothersome to patient.  Not to goal.  Cont Dupixent 300mg /29ml sq injections q 2 wks Dupixent 300mg /30ml sq injection today to L upper arm, Lot [id number] exp 03/2023  Cont TMC 0.1% cr qd/bid up to 5d/wk until clear, then prn flares  Atopic dermatitis - Severe, on Dupixent (biologic medication).  Atopic dermatitis (eczema) is a chronic, relapsing, pruritic condition that can significantly affect quality of life. It is often associated with allergic rhinitis and/or asthma and can require treatment with topical medications, phototherapy, or in severe cases a biologic medication called Dupixent, which requires long term medication management.   Dupilumab (Dupixent) is a treatment given by injection for adults and children with moderate-to-severe atopic dermatitis. Goal is control of skin condition, not cure. It is given as 2 injections at the first dose followed by 1 injection  ever 2 weeks thereafter.  Young children are dosed monthly.  Potential side effects include allergic reaction, herpes infections, injection site reactions and conjunctivitis (inflammation of the eyes).  The use of Dupixent requires long term medication management, including periodic office visits.   Topical steroids (such as triamcinolone, fluocinolone, fluocinonide, mometasone, clobetasol, halobetasol, betamethasone, hydrocortisone) can cause thinning and lightening of the skin if they are used for too long in the same area. Your physician has selected the right strength medicine for your problem and area affected on the body. Please use your medication only as directed by your physician to prevent side effects.    dupilumab (DUPIXENT) prefilled syringe 300 mg - arms, trunk, legs  triamcinolone cream (KENALOG) 0.1 % - arms, trunk, legs Apply 1 application topically as directed. Qd to bid to aa itchy rash (eczema) until clear, then prn flares, avoid face, groin, axilla  Related Medications dupilumab (DUPIXENT) 300 MG/2ML prefilled syringe Inject 300 mg into the skin every 14 (fourteen) days. Starting at day 15 for maintenance.  Return in about 2 weeks (around 04/10/2021) for Atopic Derm.  I, Othelia Pulling, RMA, am acting as scribe for Sarina Ser, MD . Documentation: I have reviewed the above documentation for accuracy and completeness, and I agree with the above.  Sarina Ser, MD

## 2021-03-27 NOTE — Patient Instructions (Signed)

## 2021-04-01 ENCOUNTER — Telehealth: Payer: Self-pay

## 2021-04-01 NOTE — Telephone Encounter (Signed)
I need pts BSA% added to chart note for Dupixent PA please.  Thank you!

## 2021-04-03 NOTE — Telephone Encounter (Signed)
Disregard adding BSA to chart. Dupixent was denied by insurance due to patient not trying a TCI. Patient has a followup on 04/10/21.

## 2021-04-05 ENCOUNTER — Encounter: Payer: Self-pay | Admitting: Dermatology

## 2021-04-09 NOTE — Telephone Encounter (Signed)
topical calcineurin inhibitors ?

## 2021-04-10 ENCOUNTER — Other Ambulatory Visit: Payer: Self-pay

## 2021-04-10 ENCOUNTER — Ambulatory Visit (INDEPENDENT_AMBULATORY_CARE_PROVIDER_SITE_OTHER): Payer: Medicare Other | Admitting: Dermatology

## 2021-04-10 DIAGNOSIS — L2081 Atopic neurodermatitis: Secondary | ICD-10-CM

## 2021-04-10 DIAGNOSIS — Z872 Personal history of diseases of the skin and subcutaneous tissue: Secondary | ICD-10-CM

## 2021-04-10 DIAGNOSIS — Z79899 Other long term (current) drug therapy: Secondary | ICD-10-CM | POA: Diagnosis not present

## 2021-04-10 DIAGNOSIS — L819 Disorder of pigmentation, unspecified: Secondary | ICD-10-CM

## 2021-04-10 MED ORDER — TACROLIMUS 0.1 % EX OINT: TOPICAL_OINTMENT | CUTANEOUS | 3 refills | Status: DC

## 2021-04-10 MED ORDER — DUPILUMAB 300 MG/2ML ~~LOC~~ SOSY
300.0000 mg | PREFILLED_SYRINGE | Freq: Once | SUBCUTANEOUS | Status: AC
  Administered 2021-04-10: 300 mg via SUBCUTANEOUS

## 2021-04-10 NOTE — Progress Notes (Signed)
? ?Follow-Up Visit ?  ?Subjective  ?Larry Parsons is a 81 y.o. male who presents for the following: Eczema (Arms, legs, trunk, Dupixent 300mg /68ml sq injections q 2wks, TMC 0.1% cr qd/bid). ? ?The following portions of the chart were reviewed this encounter and updated as appropriate:  ? Tobacco  Allergies  Meds  Problems  Med Hx  Surg Hx  Fam Hx   ?  ?Review of Systems:  No other skin or systemic complaints except as noted in HPI or Assessment and Plan. ? ?Objective  ?Well appearing patient in no apparent distress; mood and affect are within normal limits. ? ?A focused examination was performed including arms, trunk. Relevant physical exam findings are noted in the Assessment and Plan. ? ?trunk, arms, legs ?Generalized macular hyperpigmentation in previous eczema spots ? ? ?Assessment & Plan  ?Atopic neurodermatitis - BSA 70% ?trunk, arms, leg ?-severe and generalized with dyschromia and hx of lichenification and not responding to topical steroids ?Biopsy-proven eczema ?Chronic and persistent condition with duration or expected duration over one year. Condition is symptomatic / bothersome to patient.  Not to goal. ?Atopic dermatitis - Severe, on Dupixent (biologic medication).  ?Atopic dermatitis (eczema) is a chronic, relapsing, pruritic condition that can significantly affect quality of life. It is often associated with allergic rhinitis and/or asthma and can require treatment with topical medications, phototherapy, or in severe cases a biologic medication called Dupixent, which requires long term medication management.   ? ?BSA 70% ? ?Cont Dupixent 300mg /26ml sq injections qowk ?Dupixent 300mg /44ml sq injection today to R upper arm, Lot [id number] 08/25 ?Start Tacrolimus 0.1% oint qd/bid aa itchy rash  ?Cont TMC 0.1% cr qd up to 5d/wk to itchy rash until resolved, then prn itching, avoid f/g/a ? ?Topical steroids (such as triamcinolone, fluocinolone, fluocinonide, mometasone, clobetasol, halobetasol,  betamethasone, hydrocortisone) can cause thinning and lightening of the skin if they are used for too long in the same area. Your physician has selected the right strength medicine for your problem and area affected on the body. Please use your medication only as directed by your physician to prevent side effects.   ? ?Dupilumab (Dupixent) is a treatment given by injection for adults and children with moderate-to-severe atopic dermatitis. Goal is control of skin condition, not cure. It is given as 2 injections at the first dose followed by 1 injection ever 2 weeks thereafter.  Young children are dosed monthly. ? ?Potential side effects include allergic reaction, herpes infections, injection site reactions and conjunctivitis (inflammation of the eyes).  The use of Dupixent requires long term medication management, including periodic office visits.  ? ?tacrolimus (PROTOPIC) 0.1 % ointment - trunk, arms, legs ?Apply topically as directed. Qd to bid to itchy areas of eczema on body until clear then prn flares. ?Patient's insurance denied Dupixent because he had not tried a topical TCI.  We have prescribed tacrolimus/Protopic today. ? ?dupilumab (DUPIXENT) prefilled syringe 300 mg - trunk, arms, legs ? ?Related Medications ?dupilumab (DUPIXENT) 300 MG/2ML prefilled syringe ?Inject 300 mg into the skin every 14 (fourteen) days. Starting at day 15 for maintenance. ? ?triamcinolone cream (KENALOG) 0.1 % ?Apply 1 application topically as directed. Qd to bid to aa itchy rash (eczema) until clear, then prn flares, avoid face, groin, axilla ? ?Return in about 2 weeks (around 04/24/2021) for Atopic Derm. ? ?I, Othelia Pulling, RMA, am acting as scribe for Sarina Ser, MD . ?Documentation: I have reviewed the above documentation for accuracy and completeness, and I agree with  the above. ? ?Sarina Ser, MD ? ?

## 2021-04-10 NOTE — Patient Instructions (Addendum)
Start Tacrolimus ointment 0.1% 1 to 2 times a day to itchy areas of eczema ? ?With the Triamcinolone 0.1% cream use once daily up to 5 days a week to itchy areas of eczema, do not use on face, groin, or under arms. ? ? ? ? ?If You Need Anything After Your Visit ? ?If you have any questions or concerns for your doctor, please call our main line at [phone] and press option 4 to reach your doctor's medical assistant. If no one answers, please leave a voicemail as directed and we will return your call as soon as possible. Messages left after 4 pm will be answered the following business day.  ? ?You may also send [place] a message via MyChart. We typically respond to MyChart messages within 1-2 business days. ? ?For prescription refills, please ask your pharmacy to contact our office. Our fax number is [phone]. ? ?If you have an urgent issue when the clinic is closed that cannot wait until the next business day, you can page your doctor at the number below.   ? ?Please note that while we do our best to be available for urgent issues outside of office hours, we are not available 24/7.  ? ?If you have an urgent issue and are unable to reach [place], you may choose to seek medical care at your doctor's office, retail clinic, urgent care center, or emergency room. ? ?If you have a medical emergency, please immediately call 911 or go to the emergency department. ? ?Pager Numbers ? ?- Dr. Nehemiah Massed: [phone] ? ?- Dr. Laurence Ferrari: [phone] ? ?- Dr. Nicole Kindred: [phone] ? ?In the event of inclement weather, please call our main line at [phone] for an update on the status of any delays or closures. ? ?Dermatology Medication Tips: ?Please keep the boxes that topical medications come in in order to help keep track of the instructions about where and how to use these. Pharmacies typically print the medication instructions only on the boxes and not directly on the medication tubes.  ? ?If your medication is too expensive,  please contact our office at [phone] option 4 or send [place] a message through [place].  ? ?We are unable to tell what your co-pay for medications will be in advance as this is different depending on your insurance coverage. However, we may be able to find a substitute medication at lower cost or fill out paperwork to get insurance to cover a needed medication.  ? ?If a prior authorization is required to get your medication covered by your insurance company, please allow [place] 1-2 business days to complete this process. ? ?Drug prices often vary depending on where the prescription is filled and some pharmacies may offer cheaper prices. ? ?The website www.goodrx.com contains coupons for medications through different pharmacies. The prices here do not account for what the cost may be with help from insurance (it may be cheaper with your insurance), but the website can give you the price if you did not use any insurance.  ?- You can print the associated coupon and take it with your prescription to the pharmacy.  ?- You may also stop by our office during regular business hours and pick up a GoodRx coupon card.  ?- If you need your prescription sent electronically to a different pharmacy, notify our office through [facility] or by phone at [phone] option 4. ? ? ? ? ?Si Usted Necesita Algo Despu?s de Su Visita ? ?Tambi?n puede enviarnos un mensaje a trav?s  de MyChart. Por lo general respondemos a los mensajes de MyChart en el transcurso de 1 a 2 d?as h?biles. ? ?Para renovar recetas, por favor pida a su farmacia que se ponga en contacto con nuestra oficina. Nuestro n?mero de fax es el [phone]. ? ?Si tiene un asunto urgente cuando la cl?nica est? cerrada y que no puede esperar hasta el siguiente d?a h?bil, puede llamar/localizar a su doctor(a) al n?mero que aparece a continuaci?n.  ? ?Por favor, tenga en cuenta que aunque hacemos todo lo posible para estar disponibles para asuntos urgentes fuera del  horario de oficina, no estamos disponibles las 24 horas del d?a, los 7 d?as de la semana.  ? ?Si tiene un problema urgente y no puede comunicarse con nosotros, puede optar por buscar atenci?n m?dica  en el consultorio de su doctor(a), en una cl?nica privada, en un centro de atenci?n urgente o en una sala de emergencias. ? ?Si tiene Engineer, maintenance (IT) m?dica, por favor llame inmediatamente al 911 o vaya a la sala de emergencias. ? ?N?meros de b?per ? ?- Dr. Nehemiah Massed: [phone] ? ?- Dra. Moye: [phone] ? ?- Dra. Nicole Kindred: [phone] ? ?En caso de inclemencias del tiempo, por favor llame a nuestra l?nea principal al [phone] para una actualizaci?n sobre el estado de cualquier retraso o cierre. ? ?Consejos para la medicaci?n en dermatolog?a: ?Por favor, guarde las cajas en las que vienen los medicamentos de uso t?pico para ayudarle a seguir las instrucciones sobre d?nde y c?mo usarlos. Las farmacias generalmente imprimen las instrucciones del medicamento s?lo en las cajas y no directamente en los tubos del [place].  ? ?Si su medicamento es muy caro, por favor, p?ngase en contacto con Zigmund Daniel llamando al [phone] y presione la opci?n 4 o env?enos un mensaje a trav?s de MyChart.  ? ?No podemos decirle cu?l ser? su copago por los medicamentos por adelantado ya que esto es diferente dependiendo de la cobertura de su seguro. Sin embargo, es posible que podamos encontrar un medicamento sustituto a Electrical engineer un formulario para que el seguro cubra el medicamento que se considera necesario.  ? ?Si se requiere Ardelia Mems autorizaci?n previa para que su compa??a de seguros [place] su medicamento, por favor perm?tanos de 1 a 2 d?as h?biles para completar este proceso. ? ?Los precios de los medicamentos var?an con frecuencia dependiendo del Environmental consultant de d?nde se surte la receta y alguna farmacias pueden ofrecer precios m?s baratos. ? ?El sitio web www.goodrx.com tiene cupones para medicamentos de Office manager. Los precios aqu? no tienen en cuenta lo que podr?a costar con la ayuda del seguro (puede ser m?s barato con su seguro), pero el sitio web puede darle el precio si no utiliz? ning?n seguro.  ?- Puede imprimir el cup?n correspondiente y llevarlo con su receta a la farmacia.  ?- Tambi?n puede pasar por nuestra oficina durante el horario de atenci?n regular y recoger una tarjeta de cupones de GoodRx.  ?- Si necesita que su receta se env?e electr?nicamente a Chiropodist, informe a nuestra oficina a trav?s de MyChart de  o por tel?fono llamando al [phone] y presione la opci?n 4.  ?

## 2021-04-12 ENCOUNTER — Encounter: Payer: Self-pay | Admitting: Dermatology

## 2021-04-24 ENCOUNTER — Other Ambulatory Visit: Payer: Self-pay

## 2021-04-24 ENCOUNTER — Ambulatory Visit (INDEPENDENT_AMBULATORY_CARE_PROVIDER_SITE_OTHER): Payer: Medicare Other | Admitting: Dermatology

## 2021-04-24 DIAGNOSIS — L209 Atopic dermatitis, unspecified: Secondary | ICD-10-CM | POA: Diagnosis not present

## 2021-04-24 MED ORDER — DUPILUMAB 300 MG/2ML ~~LOC~~ SOSY
300.0000 mg | PREFILLED_SYRINGE | Freq: Once | SUBCUTANEOUS | Status: AC
  Administered 2021-04-24: 300 mg via SUBCUTANEOUS

## 2021-04-24 NOTE — Progress Notes (Signed)
Patient here for two week Dupixent injection for atopic dermatitis. Dupixent '300mg'$ /71m injected into left arm. Patient tolerated well. ? ?LOT: [id number] ?EXP: 02/2023 ? ?Patient also completed Dupixent [employer] enrollment forms. Dupixent has been denied by insurance but appeal is on hold. Tacrolimus was sent in at last appointment. Can continue with appeal once patient follows up with Dr. KNehemiah Massedand this medication has been tried/failed for four weeks.  ? ?Follow up with Dr. KNehemiah Massedin two weeks. ? ?AJohnsie Kindred RMA ?Documentation: I have reviewed the above documentation for accuracy and completeness, and I agree with the above. ? ?DSarina Ser MD ? ?

## 2021-04-28 ENCOUNTER — Encounter: Payer: Self-pay | Admitting: Dermatology

## 2021-05-08 ENCOUNTER — Ambulatory Visit (INDEPENDENT_AMBULATORY_CARE_PROVIDER_SITE_OTHER): Payer: Medicare Other | Admitting: Dermatology

## 2021-05-08 DIAGNOSIS — L2089 Other atopic dermatitis: Secondary | ICD-10-CM

## 2021-05-08 MED ORDER — DUPILUMAB 300 MG/2ML ~~LOC~~ SOSY
300.0000 mg | PREFILLED_SYRINGE | Freq: Once | SUBCUTANEOUS | Status: AC
  Administered 2021-05-08: 300 mg via SUBCUTANEOUS

## 2021-05-08 NOTE — Progress Notes (Deleted)
? ?  Follow-Up Visit ?  ?Subjective  ?Larry Parsons is a 81 y.o. male who presents for the following: Follow-up (Atopic dermatitis 2 week follow up - Tacrolimus qd-bid - Dupixent injection). ? ? ? ?The following portions of the chart were reviewed this encounter and updated as appropriate:  ?  ?  ? ?Review of Systems:  No other skin or systemic complaints except as noted in HPI or Assessment and Plan. ? ?Objective  ?Well appearing patient in no apparent distress; mood and affect are within normal limits. ? ?{Exam:34163::"A full examination was performed including scalp, head, eyes, ears, nose, lips, neck, chest, axillae, abdomen, back, buttocks, bilateral upper extremities, bilateral lower extremities, hands, feet, fingers, toes, fingernails, and toenails. All findings within normal limits unless otherwise noted below."} ? ?Generalized macular hyperpigmentation in areas of previous eczema spots ? ? ? ?Assessment & Plan  ?Other atopic dermatitis ? ?Atopic dermatitis - Severe, on Dupixent (biologic medication).  ?Atopic dermatitis (eczema) is a chronic, relapsing, pruritic condition that can significantly affect quality of life. It is often associated with allergic rhinitis and/or asthma and can require treatment with topical medications, phototherapy, or in severe cases a biologic medication called Dupixent, which requires long term medication management.  ? ?Chronic and persistent condition with duration or expected duration over one year. Condition is symptomatic/ bothersome to patient. Not currently at goal but improving. ? ?Tacrolimus has not been very effective. ? ?Continue Dupixent 300 mg/2 ml every 2 weeks. ? ?Dupixent injection to right upper arm today. Lot #[id number] Exp 02/2023 ? ?dupilumab (DUPIXENT) prefilled syringe 300 mg ? ? ? ?Return in about 2 weeks (around 05/22/2021) for [place]. ? ? ?

## 2021-05-08 NOTE — Progress Notes (Signed)
? ?  Follow-Up Visit ?  ?Subjective  ?Larry Parsons is a 81 y.o. male who presents for the following: Follow-up (Atopic dermatitis 2 week follow up - Tacrolimus qd-bid - Dupixent injection). ? ?The following portions of the chart were reviewed this encounter and updated as appropriate:  ? Tobacco  Allergies  Meds  Problems  Med Hx  Surg Hx  Fam Hx   ?  ?Review of Systems:  No other skin or systemic complaints except as noted in HPI or Assessment and Plan. ? ?Objective  ?Well appearing patient in no apparent distress; mood and affect are within normal limits. ? ?A focused examination was performed including arms. Relevant physical exam findings are noted in the Assessment and Plan. ? ?Generalized macular hyperpigmentation in areas of previous eczema spots ? ? ?Assessment & Plan  ?Other atopic dermatitis ?Atopic dermatitis - Severe, on Dupixent (biologic medication).  ?Atopic dermatitis (eczema) is a chronic, relapsing, pruritic condition that can significantly affect quality of life. It is often associated with allergic rhinitis and/or asthma and can require treatment with topical medications, phototherapy, or in severe cases a biologic medication called Dupixent, which requires long term medication management.  ? ?Chronic and persistent condition with duration or expected duration over one year. Condition is symptomatic/ bothersome to patient. Not currently at goal but improving. ? ?Tacrolimus has not been effective after several weeks of use. ? ?Continue Dupixent 300 mg/2 ml every 2 weeks. ? ?Dupixent injection to right upper arm today. Lot #[id number] Exp 02/2023 ? ?dupilumab (DUPIXENT) prefilled syringe 300 mg ? ?Return in about 2 weeks (around 05/22/2021) for [place]. ? ?I, Ashok Cordia, CMA, am acting as scribe for Sarina Ser, MD . ?Documentation: I have reviewed the above documentation for accuracy and completeness, and I agree with the above. ? ?Sarina Ser, MD ? ?

## 2021-05-08 NOTE — Patient Instructions (Signed)

## 2021-05-09 ENCOUNTER — Encounter: Payer: Self-pay | Admitting: Dermatology

## 2021-05-22 ENCOUNTER — Ambulatory Visit (INDEPENDENT_AMBULATORY_CARE_PROVIDER_SITE_OTHER): Payer: Medicare Other | Admitting: Dermatology

## 2021-05-22 ENCOUNTER — Encounter: Payer: Self-pay | Admitting: Dermatology

## 2021-05-22 DIAGNOSIS — L2089 Other atopic dermatitis: Secondary | ICD-10-CM

## 2021-05-22 DIAGNOSIS — Z79899 Other long term (current) drug therapy: Secondary | ICD-10-CM

## 2021-05-22 MED ORDER — DUPILUMAB 300 MG/2ML ~~LOC~~ SOSY
300.0000 mg | PREFILLED_SYRINGE | Freq: Once | SUBCUTANEOUS | Status: AC
  Administered 2021-05-22: 300 mg via SUBCUTANEOUS

## 2021-05-22 NOTE — Patient Instructions (Signed)
Dupilumab (Dupixent) is a treatment given by injection for adults and children with moderate-to-severe atopic dermatitis. Goal is control of skin condition, not cure. It is given as 2 injections at the first dose followed by 1 injection ever 2 weeks thereafter.  Young children are dosed monthly. ? ?Potential side effects include allergic reaction, herpes infections, injection site reactions and conjunctivitis (inflammation of the eyes).  The use of Dupixent requires long term medication management, including periodic office visits.  ? ? ?Gentle Skin Care Guide ? ?1. Bathe no more than once a day. ? ?2. Avoid bathing in hot water ? ?3. Use a mild soap like Dove, Vanicream, Cetaphil, CeraVe. Can use Lever 2000 or Cetaphil antibacterial soap ? ?4. Use soap only where you need it. On most days, use it under your arms, between your legs, and on your feet. Let the water rinse other areas unless visibly dirty. ? ?5. When you get out of the bath/shower, use a towel to gently blot your skin dry, don't rub it. ? ?6. While your skin is still a little damp, apply a moisturizing cream such as Vanicream, CeraVe, Cetaphil, Eucerin, Sarna lotion or plain Vaseline Jelly. For hands apply Neutrogena [place] Hand Cream or Excipial Hand Cream. ? ?7. Reapply moisturizer any time you start to itch or feel dry. ? ?8. Sometimes using free and clear laundry detergents can be helpful. Fabric softener sheets should be avoided. Downy Free & Gentle liquid, or any liquid fabric softener that is free of dyes and perfumes, it acceptable to use ? ?9. If your doctor has given you prescription creams you may apply moisturizers over them  ? ? ? ?If You Need Anything After Your Visit ? ?If you have any questions or concerns for your doctor, please call our main line at [phone] and press option 4 to reach your doctor's medical assistant. If no one answers, please leave a voicemail as directed and we will return your call as soon as possible.  Messages left after 4 pm will be answered the following business day.  ? ?You may also send [place] a message via MyChart. We typically respond to MyChart messages within 1-2 business days. ? ?For prescription refills, please ask your pharmacy to contact our office. Our fax number is [phone]. ? ?If you have an urgent issue when the clinic is closed that cannot wait until the next business day, you can page your doctor at the number below.   ? ?Please note that while we do our best to be available for urgent issues outside of office hours, we are not available 24/7.  ? ?If you have an urgent issue and are unable to reach [place], you may choose to seek medical care at your doctor's office, retail clinic, urgent care center, or emergency room. ? ?If you have a medical emergency, please immediately call 911 or go to the emergency department. ? ?Pager Numbers ? ?- Dr. Nehemiah Massed: [phone] ? ?- Dr. Laurence Ferrari: [phone] ? ?- Dr. Nicole Kindred: [phone] ? ?In the event of inclement weather, please call our main line at [phone] for an update on the status of any delays or closures. ? ?Dermatology Medication Tips: ?Please keep the boxes that topical medications come in in order to help keep track of the instructions about where and how to use these. Pharmacies typically print the medication instructions only on the boxes and not directly on the medication tubes.  ? ?If your medication is too expensive, please contact our office at [phone] option  4 or send [place] a message through [place].  ? ?We are unable to tell what your co-pay for medications will be in advance as this is different depending on your insurance coverage. However, we may be able to find a substitute medication at lower cost or fill out paperwork to get insurance to cover a needed medication.  ? ?If a prior authorization is required to get your medication covered by your insurance company, please allow [place] 1-2 business days to complete this process. ? ?Drug  prices often vary depending on where the prescription is filled and some pharmacies may offer cheaper prices. ? ?The website www.goodrx.com contains coupons for medications through different pharmacies. The prices here do not account for what the cost may be with help from insurance (it may be cheaper with your insurance), but the website can give you the price if you did not use any insurance.  ?- You can print the associated coupon and take it with your prescription to the pharmacy.  ?- You may also stop by our office during regular business hours and pick up a GoodRx coupon card.  ?- If you need your prescription sent electronically to a different pharmacy, notify our office through [facility] or by phone at [phone] option 4. ? ? ? ? ?Si Usted Necesita Algo Despu?s de Su Visita ? ?Tambi?n puede enviarnos un mensaje a trav?s de MyChart. Por lo general respondemos a los mensajes de MyChart en el transcurso de 1 a 2 d?as h?biles. ? ?Para renovar recetas, por favor pida a su farmacia que se ponga en contacto con nuestra oficina. Nuestro n?mero de fax es el [phone]. ? ?Si tiene un asunto urgente cuando la cl?nica est? cerrada y que no puede esperar hasta el siguiente d?a h?bil, puede llamar/localizar a su doctor(a) al n?mero que aparece a continuaci?n.  ? ?Por favor, tenga en cuenta que aunque hacemos todo lo posible para estar disponibles para asuntos urgentes fuera del horario de oficina, no estamos disponibles las 24 horas del d?a, los 7 d?as de la semana.  ? ?Si tiene un problema urgente y no puede comunicarse con nosotros, puede optar por buscar atenci?n m?dica  en el consultorio de su doctor(a), en una cl?nica privada, en un centro de atenci?n urgente o en una sala de emergencias. ? ?Si tiene Engineer, maintenance (IT) m?dica, por favor llame inmediatamente al 911 o vaya a la sala de emergencias. ? ?N?meros de b?per ? ?- Dr. Nehemiah Massed: [phone] ? ?- Dra. Moye: [phone] ? ?- Dra. Nicole Kindred:  [phone] ? ?En caso de inclemencias del tiempo, por favor llame a nuestra l?nea principal al [phone] para una actualizaci?n sobre el estado de cualquier retraso o cierre. ? ?Consejos para la medicaci?n en dermatolog?a: ?Por favor, guarde las cajas en las que vienen los medicamentos de uso t?pico para ayudarle a seguir las instrucciones sobre d?nde y c?mo usarlos. Las farmacias generalmente imprimen las instrucciones del medicamento s?lo en las cajas y no directamente en los tubos del [place].  ? ?Si su medicamento es muy caro, por favor, p?ngase en contacto con Zigmund Daniel llamando al [phone] y presione la opci?n 4 o env?enos un mensaje a trav?s de MyChart.  ? ?No podemos decirle cu?l ser? su copago por los medicamentos por adelantado ya que esto es diferente dependiendo de la cobertura de su seguro. Sin embargo, es posible que podamos encontrar un medicamento sustituto a Electrical engineer un formulario para que el seguro cubra el medicamento que se considera necesario.  ? ?  Si se requiere una autorizaci?n previa para que su compa??a de seguros cubra su medicamento, por favor perm?tanos de 1 a 2 d?as h?biles para completar este proceso. ? ?Los precios de los medicamentos var?an con frecuencia dependiendo del lugar de d?nde se surte la receta y alguna farmacias pueden ofrecer precios m?s baratos. ? ?El sitio web www.goodrx.com tiene cupones para medicamentos de diferentes farmacias. Los precios aqu? no tienen en cuenta lo que podr?a costar con la ayuda del seguro (puede ser m?s barato con su seguro), pero el sitio web puede darle el precio si no utiliz? ning?n seguro.  ?- Puede imprimir el cup?n correspondiente y llevarlo con su receta a la farmacia.  ?- Tambi?n puede pasar por nuestra oficina durante el horario de atenci?n regular y recoger una tarjeta de cupones de GoodRx.  ?- Si necesita que su receta se env?e electr?nicamente a una farmacia diferente, informe a nuestra oficina a trav?s de  MyChart de [place] o por tel?fono llamando al [phone] y presione la opci?n 4.  ?

## 2021-05-22 NOTE — Progress Notes (Signed)
? ?  Follow-Up Visit ?  ?Subjective  ?Larry Parsons is a 81 y.o. male who presents for the following: Eczema (2 week recheck. Tacrolimus 1-2 times daily. Currently on Dupixent. Also here for injection. Patient reports symptoms have greatly improved, itching/irritation. Discoloration is beginning to improve as well). ?He has not seen improvement with use of tacrolimus on atopic dermatitis affected skin. ? ?The following portions of the chart were reviewed this encounter and updated as appropriate:  Tobacco  Allergies  Meds  Problems  Med Hx  Surg Hx  Fam Hx   ?  ?Review of Systems: No other skin or systemic complaints except as noted in HPI or Assessment and Plan. ? ?Objective  ?Well appearing patient in no apparent distress; mood and affect are within normal limits. ? ?A focused examination was performed including face, arms. Relevant physical exam findings are noted in the Assessment and Plan. ? ? ?Assessment & Plan  ?Atopic dermatitis -severe ?scattered body ?Atopic dermatitis - Severe, on Dupixent (biologic medication).  ?Atopic dermatitis (eczema) is a chronic, relapsing, pruritic condition that can significantly affect quality of life. It is often associated with allergic rhinitis and/or asthma and can require treatment with topical medications, phototherapy, or in severe cases a biologic medication called Dupixent, which requires long term medication management. ? ?Chronic and persistent condition with duration or expected duration over one year. Condition is symptomatic/ bothersome to patient. Not currently at goal but improving. ?  ?Tacrolimus has not been effective after several weeks of use. Not improving in areas using Tacrolimus. ?  ?Continue Dupixent 300 mg/2 ml every 2 weeks. ?  ?Dupixent injection to left upper arm today. Lot #[id number] 09/2023 ? ?Related Medications ?dupilumab (DUPIXENT) prefilled syringe 300 mg ? ?Return in about 2 years (around 05/23/2023) for Dupixent Follow Up, Dupixent  Injection. ? ?I, Emelia Salisbury, CMA, am acting as scribe for Sarina Ser, MD. ?Documentation: I have reviewed the above documentation for accuracy and completeness, and I agree with the above. ? ?Sarina Ser, MD ? ? ?

## 2021-05-27 ENCOUNTER — Encounter: Payer: Self-pay | Admitting: Dermatology

## 2021-06-05 ENCOUNTER — Encounter: Payer: Self-pay | Admitting: Dermatology

## 2021-06-05 ENCOUNTER — Ambulatory Visit (INDEPENDENT_AMBULATORY_CARE_PROVIDER_SITE_OTHER): Payer: Medicare Other | Admitting: Dermatology

## 2021-06-05 DIAGNOSIS — L2089 Other atopic dermatitis: Secondary | ICD-10-CM

## 2021-06-05 DIAGNOSIS — Z79899 Other long term (current) drug therapy: Secondary | ICD-10-CM

## 2021-06-05 MED ORDER — CLOBETASOL PROP EMOLLIENT BASE 0.05 % EX CREA: TOPICAL_CREAM | CUTANEOUS | 2 refills | Status: DC

## 2021-06-05 MED ORDER — DUPILUMAB 300 MG/2ML ~~LOC~~ SOSY
300.0000 mg | PREFILLED_SYRINGE | Freq: Once | SUBCUTANEOUS | Status: AC
  Administered 2021-06-05: 300 mg via SUBCUTANEOUS

## 2021-06-05 NOTE — Patient Instructions (Signed)
Start Clobetasol cream once daily 5 days a week to arms ? ?Topical steroids (such as triamcinolone, fluocinolone, fluocinonide, mometasone, clobetasol, halobetasol, betamethasone, hydrocortisone) can cause thinning and lightening of the skin if they are used for too long in the same area. Your physician has selected the right strength medicine for your problem and area affected on the body. Please use your medication only as directed by your physician to prevent side effects.   ? ?Dupilumab (Dupixent) is a treatment given by injection for adults and children with moderate-to-severe atopic dermatitis. Goal is control of skin condition, not cure. It is given as 2 injections at the first dose followed by 1 injection ever 2 weeks thereafter.  Young children are dosed monthly. ? ?Potential side effects include allergic reaction, herpes infections, injection site reactions and conjunctivitis (inflammation of the eyes).  The use of Dupixent requires long term medication management, including periodic office visits.  ? ?If You Need Anything After Your Visit ? ?If you have any questions or concerns for your doctor, please call our main line at [phone] and press option 4 to reach your doctor's medical assistant. If no one answers, please leave a voicemail as directed and we will return your call as soon as possible. Messages left after 4 pm will be answered the following business day.  ? ?You may also send [place] a message via MyChart. We typically respond to MyChart messages within 1-2 business days. ? ?For prescription refills, please ask your pharmacy to contact our office. Our fax number is [phone]. ? ?If you have an urgent issue when the clinic is closed that cannot wait until the next business day, you can page your doctor at the number below.   ? ?Please note that while we do our best to be available for urgent issues outside of office hours, we are not available 24/7.  ? ?If you have an urgent issue and are  unable to reach [place], you may choose to seek medical care at your doctor's office, retail clinic, urgent care center, or emergency room. ? ?If you have a medical emergency, please immediately call 911 or go to the emergency department. ? ?Pager Numbers ? ?- Dr. Nehemiah Massed: [phone] ? ?- Dr. Laurence Ferrari: [phone] ? ?- Dr. Nicole Kindred: [phone] ? ?In the event of inclement weather, please call our main line at [phone] for an update on the status of any delays or closures. ? ?Dermatology Medication Tips: ?Please keep the boxes that topical medications come in in order to help keep track of the instructions about where and how to use these. Pharmacies typically print the medication instructions only on the boxes and not directly on the medication tubes.  ? ?If your medication is too expensive, please contact our office at [phone] option 4 or send [place] a message through [place].  ? ?We are unable to tell what your co-pay for medications will be in advance as this is different depending on your insurance coverage. However, we may be able to find a substitute medication at lower cost or fill out paperwork to get insurance to cover a needed medication.  ? ?If a prior authorization is required to get your medication covered by your insurance company, please allow [place] 1-2 business days to complete this process. ? ?Drug prices often vary depending on where the prescription is filled and some pharmacies may offer cheaper prices. ? ?The website www.goodrx.com contains coupons for medications through different pharmacies. The prices here do not account for what the cost may  be with help from insurance (it may be cheaper with your insurance), but the website can give you the price if you did not use any insurance.  ?- You can print the associated coupon and take it with your prescription to the pharmacy.  ?- You may also stop by our office during regular business hours and pick up a GoodRx coupon card.  ?- If you need your  prescription sent electronically to a different pharmacy, notify our office through [facility] or by phone at [phone] option 4. ? ? ? ? ?Si Usted Necesita Algo Despu?s de Su Visita ? ?Tambi?n puede enviarnos un mensaje a trav?s de MyChart. Por lo general respondemos a los mensajes de MyChart en el transcurso de 1 a 2 d?as h?biles. ? ?Para renovar recetas, por favor pida a su farmacia que se ponga en contacto con nuestra oficina. Nuestro n?mero de fax es el [phone]. ? ?Si tiene un asunto urgente cuando la cl?nica est? cerrada y que no puede esperar hasta el siguiente d?a h?bil, puede llamar/localizar a su doctor(a) al n?mero que aparece a continuaci?n.  ? ?Por favor, tenga en cuenta que aunque hacemos todo lo posible para estar disponibles para asuntos urgentes fuera del horario de oficina, no estamos disponibles las 24 horas del d?a, los 7 d?as de la semana.  ? ?Si tiene un problema urgente y no puede comunicarse con nosotros, puede optar por buscar atenci?n m?dica  en el consultorio de su doctor(a), en una cl?nica privada, en un centro de atenci?n urgente o en una sala de emergencias. ? ?Si tiene Engineer, maintenance (IT) m?dica, por favor llame inmediatamente al 911 o vaya a la sala de emergencias. ? ?N?meros de b?per ? ?- Dr. Nehemiah Massed: [phone] ? ?- Dra. Moye: [phone] ? ?- Dra. Nicole Kindred: [phone] ? ?En caso de inclemencias del tiempo, por favor llame a nuestra l?nea principal al [phone] para una actualizaci?n sobre el estado de cualquier retraso o cierre. ? ?Consejos para la medicaci?n en dermatolog?a: ?Por favor, guarde las cajas en las que vienen los medicamentos de uso t?pico para ayudarle a seguir las instrucciones sobre d?nde y c?mo usarlos. Las farmacias generalmente imprimen las instrucciones del medicamento s?lo en las cajas y no directamente en los tubos del [place].  ? ?Si su medicamento es muy caro, por favor, p?ngase en contacto con Zigmund Daniel llamando al [phone]  y presione la opci?n 4 o env?enos un mensaje a trav?s de MyChart.  ? ?No podemos decirle cu?l ser? su copago por los medicamentos por adelantado ya que esto es diferente dependiendo de la cobertura de su seguro. Sin embargo, es posible que podamos encontrar un medicamento sustituto a Electrical engineer un formulario para que el seguro cubra el medicamento que se considera necesario.  ? ?Si se requiere Ardelia Mems autorizaci?n previa para que su compa??a de seguros [place] su medicamento, por favor perm?tanos de 1 a 2 d?as h?biles para completar este proceso. ? ?Los precios de los medicamentos var?an con frecuencia dependiendo del Environmental consultant de d?nde se surte la receta y alguna farmacias pueden ofrecer precios m?s baratos. ? ?El sitio web www.goodrx.com tiene cupones para medicamentos de Airline pilot. Los precios aqu? no tienen en cuenta lo que podr?a costar con la ayuda del seguro (puede ser m?s barato con su seguro), pero el sitio web puede darle el precio si no utiliz? ning?n seguro.  ?- Puede imprimir el cup?n correspondiente y llevarlo con su receta a la farmacia.  ?- Tambi?n puede pasar por nuestra oficina  durante el horario de atenci?n regular y Charity fundraiser una tarjeta de cupones de GoodRx.  ?- Si necesita que su receta se env?e electr?nicamente a Chiropodist, informe a nuestra oficina a trav?s de MyChart de [place] o por tel?fono llamando al [phone] y presione la opci?n 4.  ?

## 2021-06-05 NOTE — Progress Notes (Signed)
? ?  Follow-Up Visit ?  ?Subjective  ?Larry Parsons is a 81 y.o. male who presents for the following: Eczema (2 week recheck and here for Dupixent injection. Started Dupixent 03/13/2021. Tolerating well. Denies adverse reactions. Patient reports symptoms have greatly improved, itching/irritation. He states he is still itching. Discoloration is beginning to improve as well. He has not seen improvement with use of tacrolimus on atopic dermatitis affected skin). ? ?The following portions of the chart were reviewed this encounter and updated as appropriate:  Tobacco  Allergies  Meds  Problems  Med Hx  Surg Hx  Fam Hx   ?  ?Review of Systems: No other skin or systemic complaints except as noted in HPI or Assessment and Plan. ? ?Objective  ?Well appearing patient in no apparent distress; mood and affect are within normal limits. ? ?A focused examination was performed including face, arms. Relevant physical exam findings are noted in the Assessment and Plan. ? ?scattered body areas ?Scattered hyperpigmented patches ? ? ?Assessment & Plan  ?Other atopic dermatitis ?scattered body areas ? ?Atopic dermatitis - Severe, on Dupixent (biologic medication).  ?Atopic dermatitis (eczema) is a chronic, relapsing, pruritic condition that can significantly affect quality of life. It is often associated with allergic rhinitis and/or asthma and can require treatment with topical medications, phototherapy, or in severe cases a biologic medication called Dupixent, which requires long term medication management. ?  ?Chronic and persistent condition with duration or expected duration over one year. Condition is symptomatic/ bothersome to patient. Not currently at goal but improving. ?  ?Tacrolimus has not been effective after several weeks of use. Not improving in areas using Tacrolimus. ?  ?Continue Dupixent 300 mg/2 ml every 2 weeks. ?Dupixent injection to right upper arm today. Lot #[id number] 09/2023 ? ?Start Clobetasol cream once  daily 5 days a week to arms ? ?Clobetasol Prop Emollient Base (CLOBETASOL PROPIONATE E) 0.05 % emollient cream - scattered body areas ?once daily 5 days a week to arms ?Related Medications ?dupilumab (DUPIXENT) prefilled syringe 300 mg ? ?Return in about 2 weeks (around 06/19/2021) for Dupixent Follow Up, Atopic Dermatitis. ? ?I, Emelia Salisbury, CMA, am acting as scribe for Sarina Ser, MD. ?Documentation: I have reviewed the above documentation for accuracy and completeness, and I agree with the above. ? ?Sarina Ser, MD ? ?

## 2021-06-17 ENCOUNTER — Encounter: Payer: Self-pay | Admitting: Dermatology

## 2021-06-18 ENCOUNTER — Ambulatory Visit (INDEPENDENT_AMBULATORY_CARE_PROVIDER_SITE_OTHER): Payer: Medicare Other | Admitting: Dermatology

## 2021-06-18 DIAGNOSIS — L2081 Atopic neurodermatitis: Secondary | ICD-10-CM | POA: Diagnosis not present

## 2021-06-18 MED ORDER — DUPILUMAB 300 MG/2ML ~~LOC~~ SOAJ
300.0000 mg | Freq: Once | SUBCUTANEOUS | Status: AC
  Administered 2021-06-18: 300 mg via SUBCUTANEOUS

## 2021-06-18 NOTE — Patient Instructions (Signed)

## 2021-06-18 NOTE — Progress Notes (Signed)
   Follow-Up Visit   Subjective  Larry Parsons is a 81 y.o. male who presents for the following: Dermatitis (Currently on Dupixent. Also here for injection. Patient reports symptoms have greatly improved, itching/irritation. Discoloration is beginning to improve as well).).  The following portions of the chart were reviewed this encounter and updated as appropriate:   Tobacco  Allergies  Meds  Problems  Med Hx  Surg Hx  Fam Hx     Review of Systems:  No other skin or systemic complaints except as noted in HPI or Assessment and Plan.  Objective  Well appearing patient in no apparent distress; mood and affect are within normal limits.  A focused examination was performed including face,exts,trunk. Relevant physical exam findings are noted in the Assessment and Plan.  exts, trunk Scattered hyperpigmented patches   Assessment & Plan  Atopic neurodermatitis exts, trunk  Atopic dermatitis - Severe, on Dupixent (biologic medication).  Atopic dermatitis (eczema) is a chronic, relapsing, pruritic condition that can significantly affect quality of life. It is often associated with allergic rhinitis and/or asthma and can require treatment with topical medications, phototherapy, or in severe cases a biologic medication called Dupixent, which requires long term medication management.   Chronic and persistent condition with duration or expected duration over one year. Condition is symptomatic/ bothersome to patient. Not currently at goal but improving.   Tacrolimus has not been effective after several weeks of use. Not improving in areas using Tacrolimus.   Continue Dupixent 300 mg/2 ml every 2 weeks. Dupixent injection to left upper arm today. Lot #[id number] 09/2023   Cont Clobetasol cream once daily 5 days a week to arms  Related Medications dupilumab (DUPIXENT) 300 MG/2ML prefilled syringe Inject 300 mg into the skin every 14 (fourteen) days. Starting at day 15 for  maintenance.  Dupilumab SOPN 300 mg  Return in about 2 weeks (around 07/02/2021).  IMarye Round, CMA, am acting as scribe for Sarina Ser, MD .  Documentation: I have reviewed the above documentation for accuracy and completeness, and I agree with the above.  Sarina Ser, MD

## 2021-07-02 ENCOUNTER — Ambulatory Visit (INDEPENDENT_AMBULATORY_CARE_PROVIDER_SITE_OTHER): Payer: Medicare Other | Admitting: Dermatology

## 2021-07-02 DIAGNOSIS — L819 Disorder of pigmentation, unspecified: Secondary | ICD-10-CM | POA: Diagnosis not present

## 2021-07-02 DIAGNOSIS — Z79899 Other long term (current) drug therapy: Secondary | ICD-10-CM | POA: Diagnosis not present

## 2021-07-02 DIAGNOSIS — L280 Lichen simplex chronicus: Secondary | ICD-10-CM | POA: Diagnosis not present

## 2021-07-02 DIAGNOSIS — L2081 Atopic neurodermatitis: Secondary | ICD-10-CM

## 2021-07-02 MED ORDER — CLOBETASOL PROPIONATE 0.05 % EX CREA: 1.0000 "application " | TOPICAL_CREAM | CUTANEOUS | 2 refills | Status: DC

## 2021-07-02 MED ORDER — DUPILUMAB 300 MG/2ML ~~LOC~~ SOSY
300.0000 mg | PREFILLED_SYRINGE | Freq: Once | SUBCUTANEOUS | Status: AC
  Administered 2021-07-02: 300 mg via SUBCUTANEOUS

## 2021-07-02 NOTE — Patient Instructions (Signed)

## 2021-07-02 NOTE — Progress Notes (Signed)
   Follow-Up Visit   Subjective  Larry Parsons is a 81 y.o. male who presents for the following: Dermatitis (Trunk, extremities, 2wk f/u Dupixent, Clobetasol cr qd 5d/wk).  The following portions of the chart were reviewed this encounter and updated as appropriate:   Tobacco  Allergies  Meds  Problems  Med Hx  Surg Hx  Fam Hx     Review of Systems:  No other skin or systemic complaints except as noted in HPI or Assessment and Plan.  Objective  Well appearing patient in no apparent distress; mood and affect are within normal limits.  A focused examination was performed including face, arms, trunk. Relevant physical exam findings are noted in the Assessment and Plan.  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  Atopic dermatitis (eczema) is a chronic, relapsing, pruritic condition that can significantly affect quality of life. It is often associated with allergic rhinitis and/or asthma and can require treatment with topical medications, phototherapy, or in severe cases a biologic medication called Dupixent, which requires long term medication management.    Pt has been on Dupixent 3 months  Chronic and persistent condition with duration or expected duration over one year. Condition is symptomatic / bothersome to patient. Not to goal.   Dupixent '300mg'$ /13m sq injections today to R upper arm Lot [id number] 09/2023  Cont Dupixent '300mg'$ /237msq injections q 2 wks Cont Clobetasol cr qd up to 5d/wk aa eczema, avoid f/g/a   Dupilumab (Dupixent) is a treatment given by injection for adults and children with moderate-to-severe atopic dermatitis. Goal is control of skin condition, not cure. It is given as 2  injections at the first dose followed by 1 injection ever 2 weeks thereafter.  Young children are dosed monthly.  Potential side effects include allergic reaction, herpes infections, injection site reactions and conjunctivitis (inflammation of the eyes).  The use of Dupixent requires long term medication management, including periodic office visits.   Topical steroids (such as triamcinolone, fluocinolone, fluocinonide, mometasone, clobetasol, halobetasol, betamethasone, hydrocortisone) can cause thinning and lightening of the skin if they are used for too long in the same area. Your physician has selected the right strength medicine for your problem and area affected on the body. Please use your medication only as directed by your physician to prevent side effects.    clobetasol cream (TEMOVATE) 0.05 % - trunk Apply 1 application. topically as directed. Qd up to 5 days a week to eczema on body, avoid face, groin, axilla  dupilumab (DUPIXENT) prefilled syringe 300 mg - trunk   Related Medications dupilumab (DUPIXENT) 300 MG/2ML prefilled syringe Inject 300 mg into the skin every 14 (fourteen) days. Starting at day 15 for maintenance.   Return in about 2 weeks (around 07/16/2021) for Atopic Derm.  I, SoOthelia PullingRMA, am acting as scribe for DaSarina SerMD . Documentation: I have reviewed the above documentation for accuracy and completeness, and I agree with the above.  DaSarina SerMD

## 2021-07-03 ENCOUNTER — Encounter: Payer: Self-pay | Admitting: Dermatology

## 2021-07-07 ENCOUNTER — Encounter: Payer: Self-pay | Admitting: Dermatology

## 2021-07-14 ENCOUNTER — Telehealth: Payer: Self-pay

## 2021-07-14 NOTE — Telephone Encounter (Signed)
[facility] calling to set up delivery for Wednesday June 7.

## 2021-07-16 ENCOUNTER — Ambulatory Visit: Payer: Medicare Other

## 2021-07-24 ENCOUNTER — Ambulatory Visit (INDEPENDENT_AMBULATORY_CARE_PROVIDER_SITE_OTHER): Payer: Medicare Other | Admitting: Dermatology

## 2021-07-24 DIAGNOSIS — L209 Atopic dermatitis, unspecified: Secondary | ICD-10-CM

## 2021-07-24 MED ORDER — DUPILUMAB 300 MG/2ML ~~LOC~~ SOSY
300.0000 mg | PREFILLED_SYRINGE | Freq: Once | SUBCUTANEOUS | Status: AC
  Administered 2021-07-24: 300 mg via SUBCUTANEOUS

## 2021-07-24 NOTE — Progress Notes (Unsigned)
Patient here today for Dupixent Injection for Severe Atopic Dermatitis.   Dupixent '300mg'$ /83m injected into left upper arm. Patient tolerated procedure well.   Lot: [id number]: 08/2023  ADicie BeamRMA Documentation: I have reviewed the above documentation for accuracy and completeness, and I agree with the above.  DSarina Ser MD

## 2021-07-29 ENCOUNTER — Encounter: Payer: Self-pay | Admitting: Internal Medicine

## 2021-07-29 ENCOUNTER — Encounter: Payer: Self-pay | Admitting: Dermatology

## 2021-07-30 ENCOUNTER — Ambulatory Visit: Payer: Medicare Other | Admitting: Anesthesiology

## 2021-07-30 ENCOUNTER — Other Ambulatory Visit: Payer: Self-pay

## 2021-07-30 ENCOUNTER — Ambulatory Visit
Admission: RE | Admit: 2021-07-30 | Discharge: 2021-07-30 | Disposition: A | Discharge status: Home / Self Care | Payer: Medicare Other | Attending: Internal Medicine | Admitting: Internal Medicine

## 2021-07-30 ENCOUNTER — Encounter: Payer: Self-pay | Admitting: Internal Medicine

## 2021-07-30 ENCOUNTER — Encounter
Admission: RE | Disposition: A | Discharge status: Home / Self Care | Payer: Self-pay | Source: Home / Self Care | Attending: Internal Medicine

## 2021-07-30 DIAGNOSIS — K219 Gastro-esophageal reflux disease without esophagitis: Secondary | ICD-10-CM | POA: Diagnosis not present

## 2021-07-30 DIAGNOSIS — D122 Benign neoplasm of ascending colon: Secondary | ICD-10-CM | POA: Diagnosis not present

## 2021-07-30 DIAGNOSIS — J449 Chronic obstructive pulmonary disease, unspecified: Secondary | ICD-10-CM | POA: Diagnosis not present

## 2021-07-30 DIAGNOSIS — Z9889 Other specified postprocedural states: Secondary | ICD-10-CM | POA: Insufficient documentation

## 2021-07-30 DIAGNOSIS — Z86010 Personal history of colonic polyps: Secondary | ICD-10-CM | POA: Insufficient documentation

## 2021-07-30 DIAGNOSIS — Z85048 Personal history of other malignant neoplasm of rectum, rectosigmoid junction, and anus: Secondary | ICD-10-CM | POA: Diagnosis not present

## 2021-07-30 DIAGNOSIS — K640 First degree hemorrhoids: Secondary | ICD-10-CM | POA: Insufficient documentation

## 2021-07-30 DIAGNOSIS — R195 Other fecal abnormalities: Secondary | ICD-10-CM | POA: Diagnosis present

## 2021-07-30 DIAGNOSIS — I10 Essential (primary) hypertension: Secondary | ICD-10-CM | POA: Insufficient documentation

## 2021-07-30 DIAGNOSIS — M1990 Unspecified osteoarthritis, unspecified site: Secondary | ICD-10-CM | POA: Diagnosis not present

## 2021-07-30 DIAGNOSIS — K5730 Diverticulosis of large intestine without perforation or abscess without bleeding: Secondary | ICD-10-CM | POA: Insufficient documentation

## 2021-07-30 SURGERY — COLONOSCOPY
Anesthesia: General

## 2021-07-30 MED ORDER — SODIUM CHLORIDE 0.9 % IV SOLN: INTRAVENOUS | Status: DC

## 2021-07-30 MED ORDER — PROPOFOL 500 MG/50ML IV EMUL
INTRAVENOUS | Status: DC | PRN
  Administered 2021-07-30: 200 ug/kg/min via INTRAVENOUS

## 2021-07-30 MED ORDER — PROPOFOL 10 MG/ML IV BOLUS
INTRAVENOUS | Status: DC | PRN
  Administered 2021-07-30: 40 mg via INTRAVENOUS

## 2021-07-30 NOTE — Interval H&P Note (Signed)
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  The various methods of treatment have been discussed with the patient and family. After consideration of risks, benefits and other options for treatment, the patient has consented to  Procedure(s): COLONOSCOPY (N/A) as a surgical intervention.  The patient's history has been reviewed, patient examined, no change in status, stable for surgery.  I have reviewed the patient's chart and labs.  Questions were answered to the patient's satisfaction.     [place], [place]

## 2021-07-30 NOTE — Op Note (Signed)
[facility] Gastroenterology Patient Name: Larry Parsons Procedure Date: 07/30/2021 10:33 AM MRN: [id number] Account #: [id number] Date of Birth: 21-Jan-1941 Admit Type: Outpatient Age: 81 Room: [facility] ENDO ROOM 2 Gender: Male Note Status: Finalized Instrument Name: Colonoscope [id number] Procedure:             Colonoscopy Indications:           Heme positive stool Providers:             Lorie Apley K. Abdirizak Richison MD, MD Medicines:             Propofol per Anesthesia Complications:         No immediate complications. Procedure:             Pre-Anesthesia Assessment:                        - The risks and benefits of the procedure and the                         sedation options and risks were discussed with the                         patient. All questions were answered and informed                         consent was obtained.                        - Patient identification and proposed procedure were                         verified prior to the procedure by the nurse. The                         procedure was verified in the procedure room.                        - ASA Grade Assessment: III - A patient with severe                         systemic disease.                        - After reviewing the risks and benefits, the patient                         was deemed in satisfactory condition to undergo the                         procedure.                        After obtaining informed consent, the colonoscope was                         passed under direct vision. Throughout the procedure,                         the patient's blood pressure, pulse, and oxygen  saturations were monitored continuously. The                         Colonoscope was introduced through the anus and                         advanced to the the cecum, identified by appendiceal                         orifice and ileocecal valve. The colonoscopy was                          performed without difficulty. The patient tolerated                         the procedure well. The quality of the bowel                         preparation was good. The ileocecal valve, appendiceal                         orifice, and rectum were photographed. Findings:      The perianal and digital rectal examinations were normal. Pertinent       negatives include normal sphincter tone and no palpable rectal lesions.      Non-bleeding internal hemorrhoids were found during retroflexion. The       hemorrhoids were Grade I (internal hemorrhoids that do not prolapse).      A small post polypectomy scar was found in the rectum. The scar tissue       was healthy in appearance. There was no evidence of the previous polyp.      A tattoo was seen in the rectum. The tattoo site appeared normal.      A few large-mouthed diverticula were found in the sigmoid colon. There       was no evidence of diverticular bleeding.      A 9 mm polyp was found in the ascending colon. The polyp was sessile.       The polyp was removed with a hot snare. Resection and retrieval were       complete. To prevent bleeding after the polypectomy, one hemostatic clip       was successfully placed (MR conditional). There was no bleeding during,       or at the end, of the procedure.      metal piece had frayed off the deployant mechanism of the clip. This       piece was removed from the colon safely with the use of a roth net and       the piece was retrieved through the scope channel. Estimated blood loss:       none.      The exam was otherwise without abnormality. Impression:            - Non-bleeding internal hemorrhoids.                        - Post-polypectomy scar in the rectum.                        - A tattoo was seen in the rectum. The tattoo site  appeared normal.                        - Mild diverticulosis in the sigmoid colon. There was                         no evidence of  diverticular bleeding.                        - One 9 mm polyp in the ascending colon, removed with                         a hot snare. Resected and retrieved. Clip (MR                         conditional) was placed.                        - The examination was otherwise normal. Recommendation:        - Patient has a contact number available for                         emergencies. The signs and symptoms of potential                         delayed complications were discussed with the patient.                         Return to normal activities tomorrow. Written                         discharge instructions were provided to the patient.                        - Resume previous diet.                        - Continue present medications.                        - If polyps are benign or adenomatous without                         dysplasia, I will advise NO further colonoscopy due to                         advanced age and/or severe comorbidity.                        - Return to GI office PRN.                        - The findings and recommendations were discussed with                         the patient. Procedure Code(s):     --- Professional ---                        [phone], Colonoscopy, flexible; with removal of  tumor(s), polyp(s), or other lesion(s) by snare                         technique Diagnosis Code(s):     --- Professional ---                        K57.30, Diverticulosis of large intestine without                         perforation or abscess without bleeding                        R19.5, Other fecal abnormalities                        K63.5, Polyp of colon                        K64.0, First degree hemorrhoids                        Z98.890, Other specified postprocedural states CPT copyright 2019 American [employer] Association. All rights reserved. The codes documented in this report are preliminary and upon coder review may  be revised to  meet current compliance requirements. Efrain Sella MD, MD 07/30/2021 11:15:28 AM This report has been signed electronically. Number of Addenda: 0 Note Initiated On: 07/30/2021 10:33 AM Scope Withdrawal Time: 0 hours 12 minutes 21 seconds  Total Procedure Duration: 0 hours 16 minutes 21 seconds  Estimated Blood Loss:  Estimated blood loss: none.      [facility]

## 2021-07-30 NOTE — Transfer of Care (Signed)
Immediate Anesthesia Transfer of Care Note  Patient: Larry Parsons  Procedure(s) Performed: COLONOSCOPY  Patient Location: PACU  Anesthesia Type:General  Level of Consciousness: sedated  Airway & Oxygen Therapy: Patient Spontanous Breathing and Patient connected to nasal cannula oxygen  Post-op Assessment: Report given to RN and Post -op Vital signs reviewed and stable  Post vital signs: Reviewed and stable  Last Vitals:  Vitals Value Taken Time  BP 125/55 07/30/21 1110  Temp 36.4 C 07/30/21 1109  Pulse 57 07/30/21 1111  Resp 13 07/30/21 1111  SpO2 98 % 07/30/21 1111  Vitals shown include unvalidated device data.  Last Pain:  Vitals:   07/30/21 1109  TempSrc: Temporal  PainSc: Asleep         Complications: No notable events documented.

## 2021-07-30 NOTE — Anesthesia Preprocedure Evaluation (Signed)
Anesthesia Evaluation  Patient identified by MRN, date of birth, ID band Patient awake    Reviewed: Allergy & Precautions, H&P , NPO status , Patient's Chart, lab work & pertinent test results, reviewed documented beta blocker date and time   Airway Mallampati: II   Neck ROM: full    Dental  (+) Poor Dentition   Pulmonary asthma , COPD,    Pulmonary exam normal        Cardiovascular Exercise Tolerance: Poor hypertension, On Medications negative cardio ROS Normal cardiovascular exam Rhythm:regular Rate:Normal     Neuro/Psych CVA negative psych ROS   GI/Hepatic GERD  Medicated,(+) Hepatitis -  Endo/Other  negative endocrine ROS  Renal/GU negative Renal ROS  negative genitourinary   Musculoskeletal   Abdominal   Peds  Hematology negative hematology ROS (+)   Anesthesia Other Findings Past Medical History: No date: Arthralgia No date: Arthritis No date: Asthma     Comment:  childhood No date: COPD (chronic obstructive pulmonary disease) (HCC) No date: Elevated liver enzymes No date: Fatty liver No date: GERD (gastroesophageal reflux disease) No date: Hepatic fibrosis No date: Hepatitis C No date: Hypertension No date: Positive colorectal cancer screening using Cologuard test No date: Stroke [facility])     Comment:  2006 Past Surgical History: 03/17/2017: COLONOSCOPY WITH PROPOFOL; N/A     Comment:  Procedure: COLONOSCOPY WITH PROPOFOL;  Surgeon: Toledo,               Benay Pike, MD;  Location: [facility] ENDOSCOPY;  Service:               Gastroenterology;  Laterality: N/A; 12/01/2018: COLONOSCOPY WITH PROPOFOL; N/A     Comment:  Procedure: COLONOSCOPY WITH PROPOFOL;  Surgeon: Toledo,               Benay Pike, MD;  Location: [facility] ENDOSCOPY;  Service:               Gastroenterology;  Laterality: N/A; 04/13/2017: FLEXIBLE SIGMOIDOSCOPY; N/A     Comment:  Procedure: FLEXIBLE SIGMOIDOSCOPY;  Surgeon: Toledo,                Benay Pike, MD;  Location: [facility] ENDOSCOPY;  Service:               Gastroenterology;  Laterality: N/A; 10/13/2017: FLEXIBLE SIGMOIDOSCOPY; N/A     Comment:  Procedure: FLEXIBLE SIGMOIDOSCOPY;  Surgeon: Toledo,               Benay Pike, MD;  Location: [facility] ENDOSCOPY;  Service:               Gastroenterology;  Laterality: N/A;   Reproductive/Obstetrics negative OB ROS                             Anesthesia Physical Anesthesia Plan  ASA: 4  Anesthesia Plan: General   Post-op Pain Management:    Induction:   PONV Risk Score and Plan:   Airway Management Planned:   Additional Equipment:   Intra-op Plan:   Post-operative Plan:   Informed Consent: I have reviewed the patients History and Physical, chart, labs and discussed the procedure including the risks, benefits and alternatives for the proposed anesthesia with the patient or authorized representative who has indicated his/her understanding and acceptance.     Dental Advisory Given  Plan Discussed with: CRNA  Anesthesia Plan Comments:         Anesthesia Quick Evaluation

## 2021-07-30 NOTE — Anesthesia Postprocedure Evaluation (Signed)
Anesthesia Post Note  Patient: Larry Parsons  Procedure(s) Performed: COLONOSCOPY  Patient location during evaluation: PACU Anesthesia Type: General Level of consciousness: awake and alert Pain management: pain level controlled Vital Signs Assessment: post-procedure vital signs reviewed and stable Respiratory status: spontaneous breathing, nonlabored ventilation, respiratory function stable and patient connected to nasal cannula oxygen Cardiovascular status: blood pressure returned to baseline and stable Postop Assessment: no apparent nausea or vomiting Anesthetic complications: no   No notable events documented.   Last Vitals:  Vitals:   07/30/21 1109 07/30/21 1119  BP: (!) 125/55 135/74  Pulse: (!) 59 (!) 59  Resp: 14 (!) 22  Temp: (!) 36.4 C   SpO2: 98% 98%    Last Pain:  Vitals:   07/30/21 1119  TempSrc:   PainSc: 0-No pain                 Molli Barrows

## 2021-07-30 NOTE — Anesthesia Procedure Notes (Signed)
Date/Time: 07/30/2021 10:53 AM  Performed by: Nelda Marseille, CRNAPre-anesthesia Checklist: Patient identified, Emergency Drugs available, Suction available, Patient being monitored and Timeout performed Oxygen Delivery Method: Nasal cannula

## 2021-07-30 NOTE — H&P (Signed)
Outpatient short stay form Pre-procedure 07/30/2021 10:37 AM Mihail Prettyman K. Alice Reichert, M.D.  Primary Physician: Jodi Marble, M.D.  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 Heme positive stool 2. Personal history of rectal cancer s/p endoscopic removal1 3. Hx of adenomatous colon polyps 4. Unintentional weight loss - 10-lbs x 1 year  History of present illness:  Recent positive hemoccult cards are a positive colorectal cancer screening test and we reviewed recommendation to have repeat colonoscopy to definitively rule out neoplasm, adenoma, or other structural abnormality - Last colonoscopy 11/2018 removed two 8-10 mm TVAs and one 6 mm TA with biopsies of his post-polypectomy scar in rectum negative for recurrence of cancer or dysplasia. Technically due for surveillance later this year, but given recent heme positive stool I recommend scheduling repeat luminal evaluation now.  -    Current Facility-Administered Medications:    0.9 %  sodium chloride infusion, , Intravenous, Continuous, [place], Benay Pike, MD, Last Rate: 20 mL/hr at 07/30/21 1034, New Bag at 07/30/21 1034  Medications Prior to Admission  Medication Sig Dispense Refill Last Dose   allopurinol (ZYLOPRIM) 100 MG tablet Take 100 mg by mouth daily.   07/29/2021   amLODipine (NORVASC) 10 MG tablet Take 10 mg by mouth daily.   07/30/2021 at 0700   budesonide-formoterol (SYMBICORT) 80-4.5 MCG/ACT inhaler Inhale 2 puffs into the lungs 2 (two) times daily.   07/29/2021   carvedilol (COREG) 25 MG tablet Take 25 mg by mouth 2 (two) times daily with a meal.   07/30/2021 at 0700   clobetasol cream (TEMOVATE) 2.99 % Apply 1 application. topically as directed. Qd up to 5 days a week to eczema on body, avoid face, groin, axilla 60 g 2 07/29/2021   colchicine 0.6 MG tablet Take 0.6 mg by mouth daily. Take 2 tablets at first sign of gout flare followed by 1 tablet after 1 hour. Max 1.'8mg'$   within 1 hour   07/29/2021   dupilumab (DUPIXENT) 300 MG/2ML prefilled syringe Inject 300 mg into the skin every 14 (fourteen) days. Starting at day 15 for maintenance. 4 mL 6 07/29/2021   febuxostat (ULORIC) 40 MG tablet Take 40 mg by mouth daily.   07/29/2021   fexofenadine (ALLEGRA) 180 MG tablet Take 180 mg by mouth daily.   07/29/2021   fluticasone (FLONASE) 50 MCG/ACT nasal spray Place into both nostrils daily.   07/29/2021   montelukast (SINGULAIR) 10 MG tablet Take 10 mg by mouth at bedtime.   07/29/2021   omega-3 acid ethyl esters (LOVAZA) 1 g capsule Take 1 g by mouth daily.   07/29/2021   omeprazole (PRILOSEC) 20 MG capsule Take 20 mg by mouth daily.   07/29/2021   tamsulosin (FLOMAX) 0.4 MG CAPS capsule Take 0.4 mg by mouth daily.   07/29/2021   tiotropium (SPIRIVA) 18 MCG inhalation capsule Place 18 mcg into inhaler and inhale daily.   07/29/2021     Allergies  Allergen Reactions   Ace Inhibitors Swelling    Other reaction(s): Angioedema, Other (See Comments), Other (see comments) Other reaction(s): Other (See Comments) Angioedema Other reaction(s): Other (See Comments) Angioedema Other reaction(s): Other (See Comments) Angioedema    Lisinopril Other (See Comments)    Angioedema   Shellfish Allergy Anaphylaxis   Penicillins Rash    Other  reaction(s): Other (See Comments) "hard spot" if injected Other reaction(s): Other (See Comments) "hard spot" if injected Other reaction(s): Other (See Comments) "hard spot" if injected Other reaction(s): Other (See Comments) "hard spot" if injected      Past Medical History:  Diagnosis Date   Arthralgia    Arthritis    Asthma    childhood   COPD (chronic obstructive pulmonary disease) (HCC)    Elevated liver enzymes    Fatty liver    GERD (gastroesophageal reflux disease)    Hepatic fibrosis    Hepatitis C    Hypertension    Positive colorectal cancer screening using Cologuard test    Stroke [facility])    2006    Review of  systems:  Otherwise negative.    Physical Exam  Gen: Alert, oriented. Appears stated age.  HEENT: [place]/AT. PERRLA. Lungs: CTA, no wheezes. CV: RR nl S1, S2. Abd: soft, benign, no masses. BS+ Ext: No edema. Pulses 2+    Planned procedures: Proceed with colonoscopy. The patient understands the nature of the planned procedure, indications, risks, alternatives and potential complications including but not limited to bleeding, infection, perforation, damage to internal organs and possible oversedation/side effects from anesthesia. The patient agrees and gives consent to proceed.  Please refer to procedure notes for findings, recommendations and patient disposition/instructions.     Niza Soderholm K. Alice Reichert, M.D. Gastroenterology 07/30/2021  10:37 AM

## 2021-07-31 ENCOUNTER — Encounter: Payer: Self-pay | Admitting: Internal Medicine

## 2021-07-31 LAB — SURGICAL PATHOLOGY

## 2021-08-07 ENCOUNTER — Ambulatory Visit (INDEPENDENT_AMBULATORY_CARE_PROVIDER_SITE_OTHER): Payer: Medicare Other | Admitting: Dermatology

## 2021-08-07 DIAGNOSIS — L209 Atopic dermatitis, unspecified: Secondary | ICD-10-CM

## 2021-08-07 MED ORDER — DUPILUMAB 300 MG/2ML ~~LOC~~ SOSY
300.0000 mg | PREFILLED_SYRINGE | Freq: Once | SUBCUTANEOUS | Status: AC
  Administered 2021-08-07: 300 mg via SUBCUTANEOUS

## 2021-08-07 NOTE — Progress Notes (Signed)
Patient here today for Dupixent Injection for Severe Atopic Dermatitis.    Dupixent '300mg'$ /73m injected into right upper arm. Patient tolerated procedure well.    Lot: [id number]: 10/2023   ADicie BeamRMA  Documentation: I have reviewed the above documentation for accuracy and completeness, and I agree with the above.  VForest Gleason MD

## 2021-08-12 ENCOUNTER — Encounter: Payer: Self-pay | Admitting: Dermatology

## 2021-08-21 ENCOUNTER — Ambulatory Visit (INDEPENDENT_AMBULATORY_CARE_PROVIDER_SITE_OTHER): Payer: Medicare Other | Admitting: Dermatology

## 2021-08-21 DIAGNOSIS — Z79899 Other long term (current) drug therapy: Secondary | ICD-10-CM | POA: Diagnosis not present

## 2021-08-21 DIAGNOSIS — L819 Disorder of pigmentation, unspecified: Secondary | ICD-10-CM

## 2021-08-21 DIAGNOSIS — L2081 Atopic neurodermatitis: Secondary | ICD-10-CM | POA: Diagnosis not present

## 2021-08-21 DIAGNOSIS — L280 Lichen simplex chronicus: Secondary | ICD-10-CM

## 2021-08-21 MED ORDER — CLOBETASOL PROPIONATE 0.05 % EX CREA: 1.0000 | TOPICAL_CREAM | CUTANEOUS | 2 refills | Status: DC

## 2021-08-21 MED ORDER — DUPILUMAB 300 MG/2ML ~~LOC~~ SOSY
300.0000 mg | PREFILLED_SYRINGE | Freq: Once | SUBCUTANEOUS | Status: AC
  Administered 2021-08-21: 300 mg via SUBCUTANEOUS

## 2021-08-21 NOTE — Patient Instructions (Signed)
Due to recent changes in healthcare laws, you may see results of your pathology and/or laboratory studies on MyChart before the doctors have had a chance to review them. We understand that in some cases there may be results that are confusing or concerning to you. Please understand that not all results are received at the same time and often the doctors may need to interpret multiple results in order to provide you with the best plan of care or course of treatment. Therefore, we ask that you please give us 2 business days to thoroughly review all your results before contacting the office for clarification. Should we see a critical lab result, you will be contacted sooner.   If You Need Anything After Your Visit  If you have any questions or concerns for your doctor, please call our main line at [phone] and press option 4 to reach your doctor's medical assistant. If no one answers, please leave a voicemail as directed and we will return your call as soon as possible. Messages left after 4 pm will be answered the following business day.   You may also send us a message via MyChart. We typically respond to MyChart messages within 1-2 business days.  For prescription refills, please ask your pharmacy to contact our office. Our fax number is [phone].  If you have an urgent issue when the clinic is closed that cannot wait until the next business day, you can page your doctor at the number below.    Please note that while we do our best to be available for urgent issues outside of office hours, we are not available 24/7.   If you have an urgent issue and are unable to reach us, you may choose to seek medical care at your doctor's office, retail clinic, urgent care center, or emergency room.  If you have a medical emergency, please immediately call 911 or go to the emergency department.  Pager Numbers  - Dr. Kowalski: [phone]  - Dr. Moye: [phone]  - Dr. Stewart:  [phone]  In the event of inclement weather, please call our main line at [phone] for an update on the status of any delays or closures.  Dermatology Medication Tips: Please keep the boxes that topical medications come in in order to help keep track of the instructions about where and how to use these. Pharmacies typically print the medication instructions only on the boxes and not directly on the medication tubes.   If your medication is too expensive, please contact our office at [phone] option 4 or send us a message through MyChart.   We are unable to tell what your co-pay for medications will be in advance as this is different depending on your insurance coverage. However, we may be able to find a substitute medication at lower cost or fill out paperwork to get insurance to cover a needed medication.   If a prior authorization is required to get your medication covered by your insurance company, please allow us 1-2 business days to complete this process.  Drug prices often vary depending on where the prescription is filled and some pharmacies may offer cheaper prices.  The website www.goodrx.com contains coupons for medications through different pharmacies. The prices here do not account for what the cost may be with help from insurance (it may be cheaper with your insurance), but the website can give you the price if you did not use any insurance.  - You can print the associated coupon and take it with   your prescription to the pharmacy.  - You may also stop by our office during regular business hours and pick up a GoodRx coupon card.  - If you need your prescription sent electronically to a different pharmacy, notify our office through [facility] MyChart or by phone at [phone] option 4.     Si Usted Necesita Algo Despus de Su Visita  Tambin puede enviarnos un mensaje a travs de MyChart. Por lo general respondemos a los mensajes de MyChart en el transcurso de 1 a 2  das hbiles.  Para renovar recetas, por favor pida a su farmacia que se ponga en contacto con nuestra oficina. Nuestro nmero de fax es el [phone].  Si tiene un asunto urgente cuando la clnica est cerrada y que no puede esperar hasta el siguiente da hbil, puede llamar/localizar a su doctor(a) al nmero que aparece a continuacin.   Por favor, tenga en cuenta que aunque hacemos todo lo posible para estar disponibles para asuntos urgentes fuera del horario de oficina, no estamos disponibles las 24 horas del da, los 7 das de la semana.   Si tiene un problema urgente y no puede comunicarse con nosotros, puede optar por buscar atencin mdica  en el consultorio de su doctor(a), en una clnica privada, en un centro de atencin urgente o en una sala de emergencias.  Si tiene una emergencia mdica, por favor llame inmediatamente al 911 o vaya a la sala de emergencias.  Nmeros de bper  - Dr. Kowalski: [phone]  - Dra. Moye: [phone]  - Dra. Stewart: [phone]  En caso de inclemencias del tiempo, por favor llame a nuestra lnea principal al [phone] para una actualizacin sobre el estado de cualquier retraso o cierre.  Consejos para la medicacin en dermatologa: Por favor, guarde las cajas en las que vienen los medicamentos de uso tpico para ayudarle a seguir las instrucciones sobre dnde y cmo usarlos. Las farmacias generalmente imprimen las instrucciones del medicamento slo en las cajas y no directamente en los tubos del medicamento.   Si su medicamento es muy caro, por favor, pngase en contacto con nuestra oficina llamando al [phone] y presione la opcin 4 o envenos un mensaje a travs de MyChart.   No podemos decirle cul ser su copago por los medicamentos por adelantado ya que esto es diferente dependiendo de la cobertura de su seguro. Sin embargo, es posible que podamos encontrar un medicamento sustituto a menor costo o llenar un formulario para que el  seguro cubra el medicamento que se considera necesario.   Si se requiere una autorizacin previa para que su compaa de seguros cubra su medicamento, por favor permtanos de 1 a 2 das hbiles para completar este proceso.  Los precios de los medicamentos varan con frecuencia dependiendo del lugar de dnde se surte la receta y alguna farmacias pueden ofrecer precios ms baratos.  El sitio web www.goodrx.com tiene cupones para medicamentos de diferentes farmacias. Los precios aqu no tienen en cuenta lo que podra costar con la ayuda del seguro (puede ser ms barato con su seguro), pero el sitio web puede darle el precio si no utiliz ningn seguro.  - Puede imprimir el cupn correspondiente y llevarlo con su receta a la farmacia.  - Tambin puede pasar por nuestra oficina durante el horario de atencin regular y recoger una tarjeta de cupones de GoodRx.  - Si necesita que su receta se enve electrnicamente a una farmacia diferente, informe a nuestra oficina a travs de MyChart de [facility]   o por telfono llamando al [phone] y presione la opcin 4.  

## 2021-08-21 NOTE — Progress Notes (Signed)
   Follow-Up Visit   Subjective  Larry Parsons is a 81 y.o. male who presents for the following: Dermatitis (Trunk, arms, legs6wk f/u, itching a little, improving, Dupixent sq injections q 2 wks, clobetasol cr prn).  The following portions of the chart were reviewed this encounter and updated as appropriate:   Tobacco  Allergies  Meds  Problems  Med Hx  Surg Hx  Fam Hx     Review of Systems:  No other skin or systemic complaints except as noted in HPI or Assessment and Plan.  Objective  Well appearing patient in no apparent distress; mood and affect are within normal limits.  A focused examination was performed including trunk, arms. Relevant physical exam findings are noted in the Assessment and Plan.  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  Atopic dermatitis (eczema) is a chronic, relapsing, pruritic condition that can significantly affect quality of life. It is often associated with allergic rhinitis and/or asthma and can require treatment with topical medications, phototherapy, or in severe cases a biologic medication called Dupixent, which requires long term medication management.     Pt has been on Dupixent 5 months   Chronic and persistent condition with duration or expected duration over one year. Condition is symptomatic / bothersome to patient. Not to goal.    Dupixent '300mg'$ /64m sq injections today to L upper arm Lot [id number] 08/2023   Cont Dupixent '300mg'$ /278msq injections q 2 wks Cont Clobetasol cr qd up to 5d/wk aa eczema, avoid f/g/a  Dupilumab (Dupixent) is a treatment given by injection for adults and  children with moderate-to-severe atopic dermatitis. Goal is control of skin condition, not cure. It is given as 2 injections at the first dose followed by 1 injection ever 2 weeks thereafter.  Young children are dosed monthly.  Potential side effects include allergic reaction, herpes infections, injection site reactions and conjunctivitis (inflammation of the eyes).  The use of Dupixent requires long term medication management, including periodic office visits.   Topical steroids (such as triamcinolone, fluocinolone, fluocinonide, mometasone, clobetasol, halobetasol, betamethasone, hydrocortisone) can cause thinning and lightening of the skin if they are used for too long in the same area. Your physician has selected the right strength medicine for your problem and area affected on the body. Please use your medication only as directed by your physician to prevent side effects.    dupilumab (DUPIXENT) prefilled syringe 300 mg - trunk, arms  Related Medications dupilumab (DUPIXENT) 300 MG/2ML prefilled syringe Inject 300 mg into the skin every 14 (fourteen) days. Starting at day 15 for maintenance.  clobetasol cream (TEMOVATE) 0.1.44 Apply 1 Application topically as directed. Qd up to 5 days a week to eczema on body, avoid face, groin, axilla  Return for 2wks with nurse for dupixent follow ups, and 4 months with Dr. KoNehemiah Massed I, SoOthelia PullingRMA, am acting as scribe for DaSarina SerMD . Documentation: I have reviewed the above documentation for accuracy and completeness, and I agree with the above.  DaSarina SerMD

## 2021-08-30 ENCOUNTER — Encounter: Payer: Self-pay | Admitting: Dermatology

## 2021-09-04 ENCOUNTER — Ambulatory Visit (INDEPENDENT_AMBULATORY_CARE_PROVIDER_SITE_OTHER): Payer: Medicare Other | Admitting: Dermatology

## 2021-09-04 DIAGNOSIS — L209 Atopic dermatitis, unspecified: Secondary | ICD-10-CM

## 2021-09-04 MED ORDER — DUPILUMAB 300 MG/2ML ~~LOC~~ SOSY
300.0000 mg | PREFILLED_SYRINGE | Freq: Once | SUBCUTANEOUS | Status: AC
  Administered 2021-09-04: 300 mg via SUBCUTANEOUS

## 2021-09-04 NOTE — Progress Notes (Signed)
Patient here today for Dupixent Injection for Severe Atopic Dermatitis.    Dupixent '300mg'$ /109m injected into right upper arm. Patient tolerated procedure well.    Lot: [id number]: 10/2023   ADicie BeamRMA Documentation: I have reviewed the above documentation for accuracy and completeness, and I agree with the above.  DSarina Ser MD

## 2021-09-07 ENCOUNTER — Encounter: Payer: Self-pay | Admitting: Dermatology

## 2021-09-18 ENCOUNTER — Other Ambulatory Visit: Payer: Self-pay

## 2021-09-18 ENCOUNTER — Ambulatory Visit (INDEPENDENT_AMBULATORY_CARE_PROVIDER_SITE_OTHER): Payer: Medicare Other | Admitting: Dermatology

## 2021-09-18 DIAGNOSIS — L209 Atopic dermatitis, unspecified: Secondary | ICD-10-CM | POA: Diagnosis not present

## 2021-09-18 DIAGNOSIS — L2081 Atopic neurodermatitis: Secondary | ICD-10-CM

## 2021-09-18 MED ORDER — DUPILUMAB 300 MG/2ML ~~LOC~~ SOSY
300.0000 mg | PREFILLED_SYRINGE | Freq: Once | SUBCUTANEOUS | Status: AC
  Administered 2021-09-18: 300 mg via SUBCUTANEOUS

## 2021-09-18 MED ORDER — DUPIXENT 300 MG/2ML ~~LOC~~ SOSY: 300.0000 mg | PREFILLED_SYRINGE | SUBCUTANEOUS | 3 refills | Status: DC

## 2021-09-18 NOTE — Progress Notes (Signed)
Refill requested faxed over from [facility] [place]. Escripted

## 2021-09-18 NOTE — Progress Notes (Signed)
Patient here today for Dupixent Injection for Severe Atopic Dermatitis.    Dupixent '300mg'$ /54m injected into left upper arm. Patient tolerated procedure well.    Lot: [id number]: 10/2023  ADicie BeamRMA Documentation: I have reviewed the above documentation for accuracy and completeness, and I agree with the above.  DSarina Ser MD

## 2021-09-23 ENCOUNTER — Encounter: Payer: Self-pay | Admitting: Dermatology

## 2021-10-02 ENCOUNTER — Ambulatory Visit (INDEPENDENT_AMBULATORY_CARE_PROVIDER_SITE_OTHER): Payer: Medicare Other | Admitting: Dermatology

## 2021-10-02 DIAGNOSIS — L209 Atopic dermatitis, unspecified: Secondary | ICD-10-CM | POA: Diagnosis not present

## 2021-10-02 MED ORDER — DUPILUMAB 300 MG/2ML ~~LOC~~ SOSY
300.0000 mg | PREFILLED_SYRINGE | Freq: Once | SUBCUTANEOUS | Status: AC
  Administered 2021-10-02: 300 mg via SUBCUTANEOUS

## 2021-10-02 NOTE — Progress Notes (Signed)
Patient here today for Dupixent Injection for Severe Atopic Dermatitis.    Dupixent '300mg'$ /17m injected into right upper arm. Patient tolerated procedure well.    Lot: [id number]: 10/2023   ADicie BeamRMA Documentation: I have reviewed the above documentation for accuracy and completeness, and I agree with the above.  DSarina Ser MD

## 2021-10-04 ENCOUNTER — Encounter: Payer: Self-pay | Admitting: Dermatology

## 2021-10-16 ENCOUNTER — Ambulatory Visit (INDEPENDENT_AMBULATORY_CARE_PROVIDER_SITE_OTHER): Payer: Medicare Other | Admitting: Dermatology

## 2021-10-16 DIAGNOSIS — L209 Atopic dermatitis, unspecified: Secondary | ICD-10-CM | POA: Diagnosis not present

## 2021-10-16 MED ORDER — DUPILUMAB 300 MG/2ML ~~LOC~~ SOSY
300.0000 mg | PREFILLED_SYRINGE | Freq: Once | SUBCUTANEOUS | Status: AC
  Administered 2021-10-16: 300 mg via SUBCUTANEOUS

## 2021-10-16 NOTE — Progress Notes (Unsigned)
Patient here today for Dupixent Injection for Severe Atopic Dermatitis.    Dupixent '300mg'$ /64m injected into left upper arm. Patient tolerated procedure well.    Lot: [id number]: 10/2023 [id number]  ADicie BeamRMA Documentation: I have reviewed the above documentation for accuracy and completeness, and I agree with the above.  DSarina Ser MD

## 2021-10-21 ENCOUNTER — Encounter: Payer: Self-pay | Admitting: Dermatology

## 2021-10-30 ENCOUNTER — Ambulatory Visit (INDEPENDENT_AMBULATORY_CARE_PROVIDER_SITE_OTHER): Payer: Medicare Other | Admitting: Dermatology

## 2021-10-30 DIAGNOSIS — L209 Atopic dermatitis, unspecified: Secondary | ICD-10-CM | POA: Diagnosis not present

## 2021-10-30 MED ORDER — DUPILUMAB 300 MG/2ML ~~LOC~~ SOSY
300.0000 mg | PREFILLED_SYRINGE | Freq: Once | SUBCUTANEOUS | Status: AC
  Administered 2021-10-30: 300 mg via SUBCUTANEOUS

## 2021-10-30 NOTE — Progress Notes (Signed)
Patient here today for Dupixent Injection for Severe Atopic Dermatitis.    Dupixent '300mg'$ /37m injected into right upper arm. Patient tolerated procedure well.    Lot: [id number] Exp: 10/2023 NIOP:[id number]  ADicie BeamRMA Documentation: I have reviewed the above documentation for accuracy and completeness, and I agree with the above.  DSarina Ser MD

## 2021-11-04 ENCOUNTER — Encounter: Payer: Self-pay | Admitting: Dermatology

## 2021-11-13 ENCOUNTER — Other Ambulatory Visit: Payer: Self-pay

## 2021-11-13 ENCOUNTER — Ambulatory Visit (INDEPENDENT_AMBULATORY_CARE_PROVIDER_SITE_OTHER): Payer: Medicare Other | Admitting: Dermatology

## 2021-11-13 DIAGNOSIS — L209 Atopic dermatitis, unspecified: Secondary | ICD-10-CM | POA: Diagnosis not present

## 2021-11-13 DIAGNOSIS — L2081 Atopic neurodermatitis: Secondary | ICD-10-CM

## 2021-11-13 MED ORDER — DUPIXENT 300 MG/2ML ~~LOC~~ SOSY: 300.0000 mg | PREFILLED_SYRINGE | SUBCUTANEOUS | 0 refills | Status: DC

## 2021-11-13 MED ORDER — DUPILUMAB 300 MG/2ML ~~LOC~~ SOSY
300.0000 mg | PREFILLED_SYRINGE | Freq: Once | SUBCUTANEOUS | Status: AC
  Administered 2021-11-13: 300 mg via SUBCUTANEOUS

## 2021-11-13 NOTE — Progress Notes (Signed)
Refill request. Escripted

## 2021-11-13 NOTE — Progress Notes (Signed)
Patient here today for Dupixent Injection for Severe Atopic Dermatitis.    Dupixent 300mg/2ml injected into left upper arm. Patient tolerated procedure well.    Lot: [id number]    Exp: 11/2023 [id number]   Abby Foster RMA Documentation: I have reviewed the above documentation for accuracy and completeness, and I agree with the above.  Rutilio Yellowhair, MD  

## 2021-11-18 ENCOUNTER — Encounter: Payer: Self-pay | Admitting: Dermatology

## 2021-11-27 ENCOUNTER — Ambulatory Visit (INDEPENDENT_AMBULATORY_CARE_PROVIDER_SITE_OTHER): Payer: Medicare Other | Admitting: Dermatology

## 2021-11-27 DIAGNOSIS — L2081 Atopic neurodermatitis: Secondary | ICD-10-CM

## 2021-11-27 MED ORDER — CLOBETASOL PROPIONATE 0.05 % EX CREA: 1.0000 | TOPICAL_CREAM | CUTANEOUS | 2 refills | Status: DC

## 2021-11-27 MED ORDER — DUPILUMAB 300 MG/2ML ~~LOC~~ SOSY
300.0000 mg | PREFILLED_SYRINGE | Freq: Once | SUBCUTANEOUS | Status: AC
  Administered 2021-11-27: 300 mg via SUBCUTANEOUS

## 2021-11-27 NOTE — Patient Instructions (Signed)
Due to recent changes in healthcare laws, you may see results of your pathology and/or laboratory studies on MyChart before the doctors have had a chance to review them. We understand that in some cases there may be results that are confusing or concerning to you. Please understand that not all results are received at the same time and often the doctors may need to interpret multiple results in order to provide you with the best plan of care or course of treatment. Therefore, we ask that you please give us 2 business days to thoroughly review all your results before contacting the office for clarification. Should we see a critical lab result, you will be contacted sooner.   If You Need Anything After Your Visit  If you have any questions or concerns for your doctor, please call our main line at [phone] and press option 4 to reach your doctor's medical assistant. If no one answers, please leave a voicemail as directed and we will return your call as soon as possible. Messages left after 4 pm will be answered the following business day.   You may also send us a message via MyChart. We typically respond to MyChart messages within 1-2 business days.  For prescription refills, please ask your pharmacy to contact our office. Our fax number is [phone].  If you have an urgent issue when the clinic is closed that cannot wait until the next business day, you can page your doctor at the number below.    Please note that while we do our best to be available for urgent issues outside of office hours, we are not available 24/7.   If you have an urgent issue and are unable to reach us, you may choose to seek medical care at your doctor's office, retail clinic, urgent care center, or emergency room.  If you have a medical emergency, please immediately call 911 or go to the emergency department.  Pager Numbers  - Dr. Kowalski: [phone]  - Dr. Moye: [phone]  - Dr. Stewart:  [phone]  In the event of inclement weather, please call our main line at [phone] for an update on the status of any delays or closures.  Dermatology Medication Tips: Please keep the boxes that topical medications come in in order to help keep track of the instructions about where and how to use these. Pharmacies typically print the medication instructions only on the boxes and not directly on the medication tubes.   If your medication is too expensive, please contact our office at [phone] option 4 or send us a message through MyChart.   We are unable to tell what your co-pay for medications will be in advance as this is different depending on your insurance coverage. However, we may be able to find a substitute medication at lower cost or fill out paperwork to get insurance to cover a needed medication.   If a prior authorization is required to get your medication covered by your insurance company, please allow us 1-2 business days to complete this process.  Drug prices often vary depending on where the prescription is filled and some pharmacies may offer cheaper prices.  The website www.goodrx.com contains coupons for medications through different pharmacies. The prices here do not account for what the cost may be with help from insurance (it may be cheaper with your insurance), but the website can give you the price if you did not use any insurance.  - You can print the associated coupon and take it with   your prescription to the pharmacy.  - You may also stop by our office during regular business hours and pick up a GoodRx coupon card.  - If you need your prescription sent electronically to a different pharmacy, notify our office through [facility] MyChart or by phone at [phone] option 4.     Si Usted Necesita Algo Despus de Su Visita  Tambin puede enviarnos un mensaje a travs de MyChart. Por lo general respondemos a los mensajes de MyChart en el transcurso de 1 a 2  das hbiles.  Para renovar recetas, por favor pida a su farmacia que se ponga en contacto con nuestra oficina. Nuestro nmero de fax es el [phone].  Si tiene un asunto urgente cuando la clnica est cerrada y que no puede esperar hasta el siguiente da hbil, puede llamar/localizar a su doctor(a) al nmero que aparece a continuacin.   Por favor, tenga en cuenta que aunque hacemos todo lo posible para estar disponibles para asuntos urgentes fuera del horario de oficina, no estamos disponibles las 24 horas del da, los 7 das de la semana.   Si tiene un problema urgente y no puede comunicarse con nosotros, puede optar por buscar atencin mdica  en el consultorio de su doctor(a), en una clnica privada, en un centro de atencin urgente o en una sala de emergencias.  Si tiene una emergencia mdica, por favor llame inmediatamente al 911 o vaya a la sala de emergencias.  Nmeros de bper  - Dr. Kowalski: [phone]  - Dra. Moye: [phone]  - Dra. Stewart: [phone]  En caso de inclemencias del tiempo, por favor llame a nuestra lnea principal al [phone] para una actualizacin sobre el estado de cualquier retraso o cierre.  Consejos para la medicacin en dermatologa: Por favor, guarde las cajas en las que vienen los medicamentos de uso tpico para ayudarle a seguir las instrucciones sobre dnde y cmo usarlos. Las farmacias generalmente imprimen las instrucciones del medicamento slo en las cajas y no directamente en los tubos del medicamento.   Si su medicamento es muy caro, por favor, pngase en contacto con nuestra oficina llamando al [phone] y presione la opcin 4 o envenos un mensaje a travs de MyChart.   No podemos decirle cul ser su copago por los medicamentos por adelantado ya que esto es diferente dependiendo de la cobertura de su seguro. Sin embargo, es posible que podamos encontrar un medicamento sustituto a menor costo o llenar un formulario para que el  seguro cubra el medicamento que se considera necesario.   Si se requiere una autorizacin previa para que su compaa de seguros cubra su medicamento, por favor permtanos de 1 a 2 das hbiles para completar este proceso.  Los precios de los medicamentos varan con frecuencia dependiendo del lugar de dnde se surte la receta y alguna farmacias pueden ofrecer precios ms baratos.  El sitio web www.goodrx.com tiene cupones para medicamentos de diferentes farmacias. Los precios aqu no tienen en cuenta lo que podra costar con la ayuda del seguro (puede ser ms barato con su seguro), pero el sitio web puede darle el precio si no utiliz ningn seguro.  - Puede imprimir el cupn correspondiente y llevarlo con su receta a la farmacia.  - Tambin puede pasar por nuestra oficina durante el horario de atencin regular y recoger una tarjeta de cupones de GoodRx.  - Si necesita que su receta se enve electrnicamente a una farmacia diferente, informe a nuestra oficina a travs de MyChart de [facility]   o por telfono llamando al [phone] y presione la opcin 4.  

## 2021-11-27 NOTE — Progress Notes (Signed)
   Follow-Up Visit   Subjective  Larry Parsons is a 81 y.o. male who presents for the following: Eczema (AD, body, 2wk f/u, pt presents for nurse visit for Dupixent injection).  The following portions of the chart were reviewed this encounter and updated as appropriate:   Tobacco  Allergies  Meds  Problems  Med Hx  Surg Hx  Fam Hx     Review of Systems:  No other skin or systemic complaints except as noted in HPI or Assessment and Plan.  Objective  Well appearing patient in no apparent distress; mood and affect are within normal limits.  A focused examination was performed including right arm, legs. Relevant physical exam findings are noted in the Assessment and Plan.   Assessment & Plan  Atopic neurodermatitis trunk, extremities  Atopic dermatitis - Severe, on Dupixent (biologic medication).  Atopic dermatitis (eczema) is a chronic, relapsing, pruritic condition that can significantly affect quality of life. It is often associated with allergic rhinitis and/or asthma and can require treatment with topical medications, phototherapy, or in severe cases a biologic medication called Dupixent, which requires long term medication management.   Chronic and persistent condition with duration or expected duration over one year. Condition is symptomatic/ bothersome to patient. Not currently at goal but improving.  Cont Dupixent '300mg'$ /73m sq injetions q 2 wks Cont Clobetasol cr qd up to 5d/wk to aa legs until clear, avoid f/g/a  Dupixent '300mg'$ /281msq injection today to R upper arm, Lot [id number] 10/2023  Dupilumab (Dupixent) is a treatment given by injection for adults and children with moderate-to-severe atopic dermatitis. Goal is control of skin condition, not cure. It is given as 2 injections at the first dose followed by 1 injection ever 2 weeks thereafter.  Young children are dosed monthly.  Potential side effects include allergic reaction, herpes infections, injection site  reactions and conjunctivitis (inflammation of the eyes).  The use of Dupixent requires long term medication management, including periodic office visits.   Topical steroids (such as triamcinolone, fluocinolone, fluocinonide, mometasone, clobetasol, halobetasol, betamethasone, hydrocortisone) can cause thinning and lightening of the skin if they are used for too long in the same area. Your physician has selected the right strength medicine for your problem and area affected on the body. Please use your medication only as directed by your physician to prevent side effects.    dupilumab (DUPIXENT) prefilled syringe 300 mg - trunk, extremities   Related Medications dupilumab (DUPIXENT) 300 MG/2ML prefilled syringe Inject 300 mg into the skin every 14 (fourteen) days. Starting at day 15 for maintenance.  clobetasol cream (TEMOVATE) 0.5.99 Apply 1 Application topically as directed. Qd up to 5 days a week to eczema on body, avoid face, groin, axilla   Return in about 2 weeks (around 12/11/2021) for Dupxient .  I, SoOthelia PullingRMA, am acting as scribe for DaSarina SerMD . Documentation: I have reviewed the above documentation for accuracy and completeness, and I agree with the above.  DaSarina SerMD

## 2021-12-07 ENCOUNTER — Encounter: Payer: Self-pay | Admitting: Dermatology

## 2021-12-11 ENCOUNTER — Ambulatory Visit (INDEPENDENT_AMBULATORY_CARE_PROVIDER_SITE_OTHER): Payer: Medicare Other | Admitting: Dermatology

## 2021-12-11 DIAGNOSIS — L209 Atopic dermatitis, unspecified: Secondary | ICD-10-CM | POA: Diagnosis not present

## 2021-12-11 MED ORDER — DUPILUMAB 300 MG/2ML ~~LOC~~ SOSY
300.0000 mg | PREFILLED_SYRINGE | Freq: Once | SUBCUTANEOUS | Status: AC
  Administered 2021-12-11: 300 mg via SUBCUTANEOUS

## 2021-12-11 NOTE — Progress Notes (Signed)
Patient here today for Dupixent Injection for Severe Atopic Dermatitis.    Dupixent '300mg'$ /69m injected into left upper arm. Patient tolerated procedure well.    Lot: [id number]   Exp: 11/2023 [id number]  ADicie BeamRMA Documentation: I have reviewed the above documentation for accuracy and completeness, and I agree with the above.  DSarina Ser MD

## 2021-12-22 ENCOUNTER — Ambulatory Visit (INDEPENDENT_AMBULATORY_CARE_PROVIDER_SITE_OTHER): Payer: Medicare Other | Admitting: Dermatology

## 2021-12-22 DIAGNOSIS — L209 Atopic dermatitis, unspecified: Secondary | ICD-10-CM | POA: Diagnosis not present

## 2021-12-22 DIAGNOSIS — Z79899 Other long term (current) drug therapy: Secondary | ICD-10-CM | POA: Diagnosis not present

## 2021-12-22 DIAGNOSIS — L299 Pruritus, unspecified: Secondary | ICD-10-CM

## 2021-12-22 DIAGNOSIS — L819 Disorder of pigmentation, unspecified: Secondary | ICD-10-CM

## 2021-12-22 MED ORDER — EUCRISA 2 % EX OINT: 1.0000 | TOPICAL_OINTMENT | Freq: Every day | CUTANEOUS | 6 refills | Status: DC

## 2021-12-22 MED ORDER — DUPIXENT 300 MG/2ML ~~LOC~~ SOSY: 300.0000 mg | PREFILLED_SYRINGE | SUBCUTANEOUS | 0 refills | Status: DC

## 2021-12-22 NOTE — Patient Instructions (Addendum)
Cont Dupixent '300mg'$ /75m sq injetions q 2 wks Continue use only to rash areas on Friday, Saturday, Sunday  - Clobetasol cream qd affected areas of rash. Avoid applying to face, groin, and axilla. Use as directed. Long-term use can cause thinning of the skin.   Start Eucrisa cream to affected areas of rash daily.   Topical steroids (such as triamcinolone, fluocinolone, fluocinonide, mometasone, clobetasol, halobetasol, betamethasone, hydrocortisone) can cause thinning and lightening of the skin if they are used for too long in the same area. Your physician has selected the right strength medicine for your problem and area affected on the body. Please use your medication only as directed by your physician to prevent side effects.    Dupilumab (Dupixent) is a treatment given by injection for adults and children with moderate-to-severe atopic dermatitis. Goal is control of skin condition, not cure. It is given as 2 injections at the first dose followed by 1 injection ever 2 weeks thereafter.  Young children are dosed monthly.  Potential side effects include allergic reaction, herpes infections, injection site reactions and conjunctivitis (inflammation of the eyes).  The use of Dupixent requires long term medication management, including periodic office visits.         Due to recent changes in healthcare laws, you may see results of your pathology and/or laboratory studies on MyChart before the doctors have had a chance to review them. We understand that in some cases there may be results that are confusing or concerning to you. Please understand that not all results are received at the same time and often the doctors may need to interpret multiple results in order to provide you with the best plan of care or course of treatment. Therefore, we ask that you please give u[place]2 business days to thoroughly review all your results before contacting the office for clarification. Should we see a critical lab  result, you will be contacted sooner.   If You Need Anything After Your Visit  If you have any questions or concerns for your doctor, please call our main line at [phone] press option 4 to reach your doctor's medical assistant. If no one answers, please leave a voicemail as directed and we will return your call as soon as possible. Messages left after 4 pm will be answered the following business day.   You may also send [place] message via [place] We typically respond to MyChart messages within 1-2 business days.  For prescription refills, please ask your pharmacy to contact our office. Our fax number is [phone]  If you have an urgent issue when the clinic is closed that cannot wait until the next business day, you can page your doctor at the number below.    Please note that while we do our best to be available for urgent issues outside of office hours, we are not available 24/7.   If you have an urgent issue and are unable to reach [place] you may choose to seek medical care at your doctor's office, retail clinic, urgent care center, or emergency room.  If you have a medical emergency, please immediately call 911 or go to the emergency department.  Pager Numbers  - Dr. KNehemiah Massed [phone] - Dr. MLaurence Ferrari [phone] - Dr. SNicole Kindred [phone] In the event of inclement weather, please call our main line at [phone] an update on the status of any delays or closures.  Dermatology Medication Tips: Please keep the boxes that topical medications come in in order  to help keep track of the instructions about where and how to use these. Pharmacies typically print the medication instructions only on the boxes and not directly on the medication tubes.   If your medication is too expensive, please contact our office at [phone] option 4 or send [place] a message through [place].   We are unable to tell what your co-pay for medications will be in advance as this is  different depending on your insurance coverage. However, we may be able to find a substitute medication at lower cost or fill out paperwork to get insurance to cover a needed medication.   If a prior authorization is required to get your medication covered by your insurance company, please allow [place] 1-2 business days to complete this process.  Drug prices often vary depending on where the prescription is filled and some pharmacies may offer cheaper prices.  The website www.goodrx.com contains coupons for medications through different pharmacies. The prices here do not account for what the cost may be with help from insurance (it may be cheaper with your insurance), but the website can give you the price if you did not use any insurance.  - You can print the associated coupon and take it with your prescription to the pharmacy.  - You may also stop by our office during regular business hours and pick up a GoodRx coupon card.  - If you need your prescription sent electronically to a different pharmacy, notify our office through [facility] or by phone at [phone] option 4.     Si Usted Necesita Algo Despus de Su Visita  Tambin puede enviarnos un mensaje a travs de Pharmacist, community. Por lo general respondemos a los mensajes de MyChart en el transcurso de 1 a 2 das hbiles.  Para renovar recetas, por favor pida a su farmacia que se ponga en contacto con nuestra oficina. Harland Dingwall de fax es [place] [phone].  Si tiene un asunto urgente cuando la clnica est cerrada y que no puede esperar hasta el siguiente da hbil, puede llamar/localizar a su doctor(a) al nmero que aparece a continuacin.   Por favor, tenga en cuenta que aunque hacemos todo lo posible para estar disponibles para asuntos urgentes fuera del horario de [place], no estamos disponibles las 24 horas del da, los 7 das de la [place].   Si tiene un problema urgente y no puede comunicarse con nosotros, puede optar por buscar  atencin mdica  en el consultorio de su doctor(a), en una clnica privada, en un centro de atencin urgente o en una sala de emergencias.  Si tiene Engineering geologist, por favor llame inmediatamente al 911 o vaya a la sala de emergencias.  Nmeros de bper  - Dr. Nehemiah Massed: [phone]  - Dra. Moye: [phone]  - Dra. Nicole Kindred: [phone]  En caso de inclemencias del [place], por favor llame a Johnsie Kindred principal al [phone] para una actualizacin sobre el [place] de cualquier retraso o cierre.  Consejos para la medicacin en dermatologa: Por favor, guarde las cajas en las que vienen los medicamentos de uso tpico para ayudarle a seguir las instrucciones sobre dnde y cmo usarlos. Las farmacias generalmente imprimen las instrucciones del medicamento slo en las cajas y no directamente en los tubos del [place].   Si su medicamento es muy caro, por favor, pngase en contacto con Zigmund Daniel llamando al [phone] y presione la opcin 4 o envenos un mensaje a travs de Pharmacist, community.   No podemos decirle cul ser su copago  por los medicamentos por adelantado ya que esto es diferente dependiendo de la cobertura de su seguro. Sin embargo, es posible que podamos encontrar un medicamento sustituto a Electrical engineer un formulario para que el seguro cubra el medicamento que se considera necesario.   Si se requiere una autorizacin previa para que su compaa de seguros [place] su medicamento, por favor permtanos de 1 a 2 das hbiles para completar este proceso.  Los precios de los medicamentos varan con frecuencia dependiendo del Environmental consultant de dnde se surte la receta y alguna farmacias pueden ofrecer precios ms baratos.  El sitio web www.goodrx.com tiene cupones para medicamentos de Airline pilot. Los precios aqu no tienen en cuenta lo que podra costar con la ayuda del seguro (puede ser ms barato con su seguro), pero el sitio web puede darle el precio si no utiliz  Research scientist (physical sciences).  - Puede imprimir el cupn correspondiente y llevarlo con su receta a la farmacia.  - Tambin puede pasar por nuestra oficina durante el horario de atencin regular y Charity fundraiser una tarjeta de cupones de GoodRx.  - Si necesita que su receta se enve electrnicamente a una farmacia diferente, informe a nuestra oficina a travs de MyChart de [place] o por telfono llamando al [phone] y presione la opcin 4.

## 2021-12-22 NOTE — Progress Notes (Signed)
   Follow-Up Visit   Subjective  Larry Parsons is a 81 y.o. male who presents for the following: Eczema (4 month follow up. Has improved while on dupixent at trunk and extremities. ). The patient has spots, moles and lesions to be evaluated, some may be new or changing and the patient has concerns that these could be cancer.  The following portions of the chart were reviewed this encounter and updated as appropriate:  Tobacco  Allergies  Meds  Problems  Med Hx  Surg Hx  Fam Hx     Review of Systems: No other skin or systemic complaints except as noted in HPI or Assessment and Plan.  Objective  Well appearing patient in no apparent distress; mood and affect are within normal limits.  A focused examination was performed including arms, legs, back, shoulders, and chest. Relevant physical exam findings are noted in the Assessment and Plan.  trunk and extremities                    Assessment & Plan  Atopic dermatitis, severe with dyschromia Much improved with decreased itching and rash on Dupixent. trunk and extremities  Atopic dermatitis - Severe, on Dupixent (biologic medication).  Atopic dermatitis (eczema) is a chronic, relapsing, pruritic condition that can significantly affect quality of life. It is often associated with allergic rhinitis and/or asthma and can require treatment with topical medications, phototherapy, or in severe cases a biologic medication called Dupixent, which requires long term medication management.   Chronic and persistent condition with duration or expected duration over one year. Condition is symptomatic/ bothersome to patient. Not currently at goal but improving.   Cont Dupixent '300mg'$ /11m sq injetions q 2 wks Cont Clobetasol cr qd up to 5d/wk to aa legs until clear, avoid f/g/a use only eczema rash f sa sun only  avoid face groin under arms  Continue Eucrisa cream to aa qd     Dupixent '300mg'$ /268msq injection today to R upper arm,  Lot [id number] 11/2023   Dupilumab (Dupixent) is a treatment given by injection for adults and children with moderate-to-severe atopic dermatitis. Goal is control of skin condition, not cure. It is given as 2 injections at the first dose followed by 1 injection ever 2 weeks thereafter.  Young children are dosed monthly.   Potential side effects include allergic reaction, herpes infections, injection site reactions and conjunctivitis (inflammation of the eyes).  The use of Dupixent requires long term medication management, including periodic office visits.    Topical steroids (such as triamcinolone, fluocinolone, fluocinonide, mometasone, clobetasol, halobetasol, betamethasone, hydrocortisone) can cause thinning and lightening of the skin if they are used for too long in the same area. Your physician has selected the right strength medicine for your problem and area affected on the body. Please use your medication only as directed by your physician to prevent side effects.    dupilumab (DUPIXENT) 300 MG/2ML prefilled syringe - trunk and extremities Inject 300 mg into the skin every 14 (fourteen) days. Starting at day 15 for maintenance.  Crisaborole (EUCRISA) 2 % OINT - trunk and extremities Apply 1 Application topically daily. For rash at body (atopic dermatitis)  Return for 2 week dupixent nurse visit , 6 month atopic derm follow up.  I, Ruthell RummageCMA, am acting as scribe for DaSarina SerMD. Documentation: I have reviewed the above documentation for accuracy and completeness, and I agree with the above.  DaSarina SerMD

## 2021-12-25 ENCOUNTER — Ambulatory Visit: Payer: Medicare Other

## 2021-12-26 ENCOUNTER — Encounter: Payer: Self-pay | Admitting: Dermatology

## 2021-12-27 ENCOUNTER — Encounter: Payer: Self-pay | Admitting: Dermatology

## 2022-01-05 ENCOUNTER — Ambulatory Visit (INDEPENDENT_AMBULATORY_CARE_PROVIDER_SITE_OTHER): Payer: Medicare Other | Admitting: Dermatology

## 2022-01-05 DIAGNOSIS — L209 Atopic dermatitis, unspecified: Secondary | ICD-10-CM | POA: Diagnosis not present

## 2022-01-05 MED ORDER — DUPILUMAB 300 MG/2ML ~~LOC~~ SOSY
300.0000 mg | PREFILLED_SYRINGE | Freq: Once | SUBCUTANEOUS | Status: AC
  Administered 2022-01-05: 300 mg via SUBCUTANEOUS

## 2022-01-05 NOTE — Progress Notes (Signed)
Patient here today for Dupixent Injection for Severe Atopic Dermatitis.    Dupixent '300mg'$ /52m injected into left upper arm. Patient tolerated procedure well.    Lot: [id number]   Exp: 04/2024 NDHW:8616-8372-90  ADicie BeamRMA Documentation: I have reviewed the above documentation for accuracy and completeness, and I agree with the above.  DSarina Ser MD

## 2022-01-18 ENCOUNTER — Encounter: Payer: Self-pay | Admitting: Dermatology

## 2022-01-19 ENCOUNTER — Ambulatory Visit (INDEPENDENT_AMBULATORY_CARE_PROVIDER_SITE_OTHER): Payer: Medicare Other

## 2022-01-19 DIAGNOSIS — L209 Atopic dermatitis, unspecified: Secondary | ICD-10-CM

## 2022-01-19 MED ORDER — DUPILUMAB 300 MG/2ML ~~LOC~~ SOSY
300.0000 mg | PREFILLED_SYRINGE | Freq: Once | SUBCUTANEOUS | Status: AC
  Administered 2022-01-19: 300 mg via SUBCUTANEOUS

## 2022-01-19 NOTE — Progress Notes (Signed)
Patient here today for Dupixent Injection for Severe Atopic Dermatitis.    Dupixent '300mg'$ /3m injected into right upper arm. Patient tolerated procedure well.    Lot: [id number]   Exp: 04/2024 NFUW:7218-2883-37  ADicie BeamRMA

## 2022-02-04 ENCOUNTER — Ambulatory Visit (INDEPENDENT_AMBULATORY_CARE_PROVIDER_SITE_OTHER): Payer: Medicare Other

## 2022-02-04 DIAGNOSIS — L209 Atopic dermatitis, unspecified: Secondary | ICD-10-CM

## 2022-02-04 MED ORDER — DUPIXENT 300 MG/2ML ~~LOC~~ SOSY
300.0000 mg | PREFILLED_SYRINGE | SUBCUTANEOUS | 0 refills | Status: DC
Start: 2022-02-04 — End: 2022-04-21

## 2022-02-04 NOTE — Progress Notes (Signed)
Patient here today for two week Dupixent injection for severe Atopic Dermatitis.   Dupixent '300mg'$  injected into patient left upper arm. Patient tolerated well. Patient has no injections here, sample used.   LOT: [id number] EXP: 04/2024 NDC: 4461-9012-22  Johnsie Kindred, RMA

## 2022-02-18 ENCOUNTER — Ambulatory Visit (INDEPENDENT_AMBULATORY_CARE_PROVIDER_SITE_OTHER): Payer: Medicare Other

## 2022-02-18 ENCOUNTER — Other Ambulatory Visit: Payer: Self-pay

## 2022-02-18 DIAGNOSIS — L209 Atopic dermatitis, unspecified: Secondary | ICD-10-CM

## 2022-02-18 DIAGNOSIS — L2081 Atopic neurodermatitis: Secondary | ICD-10-CM

## 2022-02-18 MED ORDER — DUPIXENT 300 MG/2ML ~~LOC~~ SOSY: 300.0000 mg | PREFILLED_SYRINGE | SUBCUTANEOUS | 3 refills | Status: DC

## 2022-02-18 MED ORDER — DUPILUMAB 300 MG/2ML ~~LOC~~ SOSY
300.0000 mg | PREFILLED_SYRINGE | Freq: Once | SUBCUTANEOUS | Status: AC
  Administered 2022-02-18: 300 mg via SUBCUTANEOUS

## 2022-02-18 NOTE — Progress Notes (Signed)
Patient here today for two week Dupixent injection for severe Atopic Dermatitis.    Dupixent '300mg'$  injected into patient right upper arm. Patient tolerated well.    LOT: [id number] EXP: 04/2024 NDC: 9480-1655-37  Dicie Beam RMA

## 2022-02-18 NOTE — Progress Notes (Signed)
Dupixent RX to [facility] for a new RX. aw

## 2022-03-04 ENCOUNTER — Ambulatory Visit (INDEPENDENT_AMBULATORY_CARE_PROVIDER_SITE_OTHER): Payer: Medicare Other

## 2022-03-04 DIAGNOSIS — L209 Atopic dermatitis, unspecified: Secondary | ICD-10-CM

## 2022-03-04 MED ORDER — DUPILUMAB 300 MG/2ML ~~LOC~~ SOSY
300.0000 mg | PREFILLED_SYRINGE | Freq: Once | SUBCUTANEOUS | Status: AC
  Administered 2022-03-04: 300 mg via SUBCUTANEOUS

## 2022-03-04 NOTE — Progress Notes (Signed)
Patient here today for two week Dupixent injection for severe Atopic Dermatitis.    Dupixent '300mg'$  injected into patient left upper arm. Patient tolerated well.     LOT: [id number] EXP: 04/2024 NDC: 8550-1586-82   Dicie Beam RMA

## 2022-03-18 ENCOUNTER — Ambulatory Visit (INDEPENDENT_AMBULATORY_CARE_PROVIDER_SITE_OTHER): Payer: Medicare Other

## 2022-03-18 DIAGNOSIS — L209 Atopic dermatitis, unspecified: Secondary | ICD-10-CM | POA: Diagnosis not present

## 2022-03-18 MED ORDER — DUPILUMAB 300 MG/2ML ~~LOC~~ SOSY
300.0000 mg | PREFILLED_SYRINGE | Freq: Once | SUBCUTANEOUS | Status: AC
  Administered 2022-03-18: 300 mg via SUBCUTANEOUS

## 2022-03-18 NOTE — Progress Notes (Signed)
Patient here today for two week Dupixent injection for severe Atopic Dermatitis.    Dupixent '300mg'$  injected into patient right upper arm. Patient tolerated well.     LOT: [id number] EXP: 04/2024 NDC: 1478-2956-21   Dicie Beam RMA

## 2022-04-01 ENCOUNTER — Ambulatory Visit (INDEPENDENT_AMBULATORY_CARE_PROVIDER_SITE_OTHER): Payer: Medicare Other

## 2022-04-01 DIAGNOSIS — L209 Atopic dermatitis, unspecified: Secondary | ICD-10-CM

## 2022-04-01 MED ORDER — DUPILUMAB 300 MG/2ML ~~LOC~~ SOSY
300.0000 mg | PREFILLED_SYRINGE | Freq: Once | SUBCUTANEOUS | Status: AC
  Administered 2022-04-01: 300 mg via SUBCUTANEOUS

## 2022-04-01 NOTE — Progress Notes (Signed)
Patient here today for two week Dupixent injection for severe Atopic Dermatitis.    Dupixent 330m injected into patient left upper arm. Patient tolerated well.     LOT: [id number]: 05/2024 NDC: [id number]  ADicie BeamRMA

## 2022-04-08 ENCOUNTER — Ambulatory Visit (INDEPENDENT_AMBULATORY_CARE_PROVIDER_SITE_OTHER): Payer: Medicare Other | Admitting: Dermatology

## 2022-04-08 ENCOUNTER — Encounter: Payer: Self-pay | Admitting: Dermatology

## 2022-04-08 VITALS — BP 181/97 | HR 53

## 2022-04-08 DIAGNOSIS — Z79899 Other long term (current) drug therapy: Secondary | ICD-10-CM

## 2022-04-08 DIAGNOSIS — Z7189 Other specified counseling: Secondary | ICD-10-CM | POA: Diagnosis not present

## 2022-04-08 DIAGNOSIS — L209 Atopic dermatitis, unspecified: Secondary | ICD-10-CM | POA: Diagnosis not present

## 2022-04-08 NOTE — Patient Instructions (Addendum)
Recommend cerave cream as a daily moisturizer  Recommend dove sensitive skin soap        Gentle Skin Care Guide  1. Bathe no more than once a day.  2. Avoid bathing in hot water  3. Use a mild soap like Dove, Vanicream, Cetaphil, CeraVe. Can use Lever 2000 or Cetaphil antibacterial soap  4. Use soap only where you need it. On most days, use it under your arms, between your legs, and on your feet. Let the water rinse other areas unless visibly dirty.  5. When you get out of the bath/shower, use a towel to gently blot your skin dry, don't rub it.  6. While your skin is still a little damp, apply a moisturizing cream such as Vanicream, CeraVe, Cetaphil, Eucerin, Sarna lotion or plain Vaseline Jelly. For hands apply Neutrogena [place] Hand Cream or Excipial Hand Cream.  7. Reapply moisturizer any time you start to itch or feel dry.  8. Sometimes using free and clear laundry detergents can be helpful. Fabric softener sheets should be avoided. Downy Free & Gentle liquid, or any liquid fabric softener that is free of dyes and perfumes, it acceptable to use  9. If your doctor has given you prescription creams you may apply moisturizers over them         Due to recent changes in healthcare laws, you may see results of your pathology and/or laboratory studies on MyChart before the doctors have had a chance to review them. We understand that in some cases there may be results that are confusing or concerning to you. Please understand that not all results are received at the same time and often the doctors may need to interpret multiple results in order to provide you with the best plan of care or course of treatment. Therefore, we ask that you please give [place] 2 business days to thoroughly review all your results before contacting the office for clarification. Should we see a critical lab result, you will be contacted sooner.   If You Need Anything After Your Visit  If you have any  questions or concerns for your doctor, please call our main line at [phone] and press option 4 to reach your doctor's medical assistant. If no one answers, please leave a voicemail as directed and we will return your call as soon as possible. Messages left after 4 pm will be answered the following business day.   You may also send [place] a message via [place]. We typically respond to MyChart messages within 1-2 business days.  For prescription refills, please ask your pharmacy to contact our office. Our fax number is [phone].  If you have an urgent issue when the clinic is closed that cannot wait until the next business day, you can page your doctor at the number below.    Please note that while we do our best to be available for urgent issues outside of office hours, we are not available 24/7.   If you have an urgent issue and are unable to reach [place], you may choose to seek medical care at your doctor's office, retail clinic, urgent care center, or emergency room.  If you have a medical emergency, please immediately call 911 or go to the emergency department.  Pager Numbers  - Dr. Nehemiah Massed: [phone]  - Dr. Laurence Ferrari: [phone]  - Dr. Nicole Kindred: [phone]  In the event of inclement weather, please call our main line at [phone] for an update on the status of any delays or closures.  Dermatology Medication Tips: Please keep the boxes that topical medications come in in order to help keep track of the instructions about where and how to use these. Pharmacies typically print the medication instructions only on the boxes and not directly on the medication tubes.   If your medication is too expensive, please contact our office at [phone] option 4 or send [place] a message through [place].   We are unable to tell what your co-pay for medications will be in advance as this is different depending on your insurance coverage. However, we may be able to find a substitute medication at  lower cost or fill out paperwork to get insurance to cover a needed medication.   If a prior authorization is required to get your medication covered by your insurance company, please allow [place] 1-2 business days to complete this process.  Drug prices often vary depending on where the prescription is filled and some pharmacies may offer cheaper prices.  The website www.goodrx.com contains coupons for medications through different pharmacies. The prices here do not account for what the cost may be with help from insurance (it may be cheaper with your insurance), but the website can give you the price if you did not use any insurance.  - You can print the associated coupon and take it with your prescription to the pharmacy.  - You may also stop by our office during regular business hours and pick up a GoodRx coupon card.  - If you need your prescription sent electronically to a different pharmacy, notify our office through [facility] or by phone at [phone] option 4.     Si Usted Necesita Algo Despus de Su Visita  Tambin puede enviarnos un mensaje a travs de Pharmacist, community. Por lo general respondemos a los mensajes de MyChart en el transcurso de 1 a 2 das hbiles.  Para renovar recetas, por favor pida a su farmacia que se ponga en contacto con nuestra oficina. Harland Dingwall de fax es [place] [phone].  Si tiene un asunto urgente cuando la clnica est cerrada y que no puede esperar hasta el siguiente da hbil, puede llamar/localizar a su doctor(a) al nmero que aparece a continuacin.   Por favor, tenga en cuenta que aunque hacemos todo lo posible para estar disponibles para asuntos urgentes fuera del horario de [place], no estamos disponibles las 24 horas del da, los 7 das de la [place].   Si tiene un problema urgente y no puede comunicarse con nosotros, puede optar por buscar atencin mdica  en el consultorio de su doctor(a), en una clnica privada, en un centro de atencin urgente  o en una sala de emergencias.  Si tiene Engineering geologist, por favor llame inmediatamente al 911 o vaya a la sala de emergencias.  Nmeros de bper  - Dr. Nehemiah Massed: [phone]  - Dra. Moye: [phone]  - Dra. Nicole Kindred: [phone]  En caso de inclemencias del [place], por favor llame a Johnsie Kindred principal al [phone] para una actualizacin sobre el [place] de cualquier retraso o cierre.  Consejos para la medicacin en dermatologa: Por favor, guarde las cajas en las que vienen los medicamentos de uso tpico para ayudarle a seguir las instrucciones sobre dnde y cmo usarlos. Las farmacias generalmente imprimen las instrucciones del medicamento slo en las cajas y no directamente en los tubos del [place].   Si su medicamento es muy caro, por favor, pngase en contacto con Zigmund Daniel llamando al [phone] y presione la opcin 4 o envenos un  mensaje a travs de MyChart.   No podemos decirle cul ser su copago por los medicamentos por adelantado ya que esto es diferente dependiendo de la cobertura de su seguro. Sin embargo, es posible que podamos encontrar un medicamento sustituto a Electrical engineer un formulario para que el seguro cubra el medicamento que se considera necesario.   Si se requiere una autorizacin previa para que su compaa de seguros [place] su medicamento, por favor permtanos de 1 a 2 das hbiles para completar este proceso.  Los precios de los medicamentos varan con frecuencia dependiendo del Environmental consultant de dnde se surte la receta y alguna farmacias pueden ofrecer precios ms baratos.  El sitio web www.goodrx.com tiene cupones para medicamentos de Airline pilot. Los precios aqu no tienen en cuenta lo que podra costar con la ayuda del seguro (puede ser ms barato con su seguro), pero el sitio web puede darle el precio si no utiliz Research scientist (physical sciences).  - Puede imprimir el cupn correspondiente y llevarlo con su receta a la farmacia.  - Tambin  puede pasar por nuestra oficina durante el horario de atencin regular y Charity fundraiser una tarjeta de cupones de GoodRx.  - Si necesita que su receta se enve electrnicamente a una farmacia diferente, informe a nuestra oficina a travs de MyChart de [place] o por telfono llamando al [phone] y presione la opcin 4.

## 2022-04-08 NOTE — Progress Notes (Signed)
Follow-Up Visit   Subjective  Larry Parsons is a 82 y.o. male who presents for the following: Eczema (Arms and back flared today, was on dupixent  injections,. Feels dupixent is not helping and still breaking out at flares. ). The patient has spots, moles and lesions to be evaluated, some may be new or changing and the patient has concerns that these could be cancer.  The following portions of the chart were reviewed this encounter and updated as appropriate:  Tobacco  Allergies  Meds  Problems  Med Hx  Surg Hx  Fam Hx     Review of Systems: No other skin or systemic complaints except as noted in HPI or Assessment and Plan.  Objective  Well appearing patient in no apparent distress; mood and affect are within normal limits.  All skin waist up examined.  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 Condition is symptomatic / bothersome to patient.  Feb 2023 started dupixent  Chronic and persistent condition with duration or expected duration over one year. Condition is bothersome/symptomatic for patient. Currently flared.  Recommend use dove sensitive skin soap Recommend use cerave cream as daily moisturizer Gentle skin care guide included  Photos today Will recheck in 2 weeks.   Atopic dermatitis - Severe, on Dupixent (biologic medication).  Atopic dermatitis (eczema) is a chronic, relapsing, pruritic condition that can significantly affect quality of life. It is often associated with allergic rhinitis and/or asthma and can require treatment with topical medications, phototherapy, or in severe cases a biologic medication called Dupixent, which requires long term  medication management.  Cont Dupixent '300mg'$ /72m sq injetions q 2 wks Cont Clobetasol cr qd up to 5d/wk to aa legs until clear, avoid f/g/a use only eczema rash f sa sun only  avoid face groin under arms  Continue Eucrisa cream to aa qd   Dupilumab (Dupixent) is a treatment given by injection for adults and children with moderate-to-severe atopic dermatitis. Goal is control of skin condition, not cure. It is given as 2 injections at the first dose followed by 1 injection ever 2 weeks thereafter.  Young children are dosed monthly.   Potential side effects include allergic reaction, herpes infections, injection site reactions and conjunctivitis (inflammation of the eyes).  The use of Dupixent requires long term medication management, including periodic office visits.    Topical steroids (such as triamcinolone, fluocinolone, fluocinonide, mometasone, clobetasol, halobetasol, betamethasone, hydrocortisone) can cause thinning and lightening of the skin if they are used for too long in the same area. Your physician has selected the right strength medicine for your problem and area affected on the body. Please use your medication only as directed by your physician to prevent side effects.   Related Medications dupilumab (DUPIXENT) 300 MG/2ML prefilled syringe Inject 300 mg into the skin every 14 (fourteen) days. Starting at day 15 for maintenance.  Crisaborole (EUCRISA) 2 % OINT Apply 1 Application topically daily. For rash at body (atopic dermatitis)  Return for switch nurse visit in 3 weeks for dupixent shot to appt with Dr. KRaliegh Ipfor atopic follow up and dupixent.  IGarry Heater CMA, am acting as scribe for DSarina Ser MD. Documentation: I have reviewed the above documentation  for accuracy and completeness, and I agree with the above.  Sarina Ser, MD

## 2022-04-15 ENCOUNTER — Encounter: Payer: Self-pay | Admitting: Dermatology

## 2022-04-15 ENCOUNTER — Ambulatory Visit: Payer: Medicare Other

## 2022-04-16 ENCOUNTER — Other Ambulatory Visit: Payer: Self-pay | Admitting: Internal Medicine

## 2022-04-16 ENCOUNTER — Other Ambulatory Visit: Payer: Medicare Other

## 2022-04-17 LAB — HEPATIC FUNCTION PANEL
ALT: 9 IU/L (ref 0–44)
AST: 15 IU/L (ref 0–40)
Albumin: 4 g/dL (ref 3.7–4.7)
Alkaline Phosphatase: 50 IU/L (ref 44–121)
Bilirubin Total: 0.7 mg/dL (ref 0.0–1.2)
Bilirubin, Direct: 0.17 mg/dL (ref 0.00–0.40)
Total Protein: 6.5 g/dL (ref 6.0–8.5)

## 2022-04-17 LAB — CBC WITH DIFFERENTIAL/PLATELET
Basophils Absolute: 0 10*3/uL (ref 0.0–0.2)
Basos: 1 %
EOS (ABSOLUTE): 0.8 10*3/uL — ABNORMAL HIGH (ref 0.0–0.4)
Eos: 15 %
Hematocrit: 39.2 % (ref 37.5–51.0)
Hemoglobin: 12.8 g/dL — ABNORMAL LOW (ref 13.0–17.7)
Immature Grans (Abs): 0 10*3/uL (ref 0.0–0.1)
Immature Granulocytes: 0 %
Lymphocytes Absolute: 1 10*3/uL (ref 0.7–3.1)
Lymphs: 20 %
MCH: 31.3 pg (ref 26.6–33.0)
MCHC: 32.7 g/dL (ref 31.5–35.7)
MCV: 96 fL (ref 79–97)
Monocytes Absolute: 0.6 10*3/uL (ref 0.1–0.9)
Monocytes: 11 %
Neutrophils Absolute: 2.8 10*3/uL (ref 1.4–7.0)
Neutrophils: 53 %
Platelets: 153 10*3/uL (ref 150–450)
RBC: 4.09 x10E6/uL — ABNORMAL LOW (ref 4.14–5.80)
RDW: 12.4 % (ref 11.6–15.4)
WBC: 5.3 10*3/uL (ref 3.4–10.8)

## 2022-04-17 LAB — LIPID PANEL W/O CHOL/HDL RATIO
Cholesterol, Total: 167 mg/dL (ref 100–199)
HDL: 71 mg/dL (ref >39)
LDL Chol Calc (NIH): 83 mg/dL (ref 0–99)
Triglycerides: 66 mg/dL (ref 0–149)
VLDL Cholesterol Cal: 13 mg/dL (ref 5–40)

## 2022-04-17 LAB — URIC ACID: Uric Acid: 5.5 mg/dL (ref 3.8–8.4)

## 2022-04-21 ENCOUNTER — Ambulatory Visit (INDEPENDENT_AMBULATORY_CARE_PROVIDER_SITE_OTHER): Payer: Medicare Other | Admitting: Internal Medicine

## 2022-04-21 ENCOUNTER — Encounter: Payer: Self-pay | Admitting: Internal Medicine

## 2022-04-21 VITALS — BP 140/90 | HR 60 | Ht 70.0 in | Wt 161.8 lb

## 2022-04-21 DIAGNOSIS — J449 Chronic obstructive pulmonary disease, unspecified: Secondary | ICD-10-CM | POA: Diagnosis not present

## 2022-04-21 DIAGNOSIS — N401 Enlarged prostate with lower urinary tract symptoms: Secondary | ICD-10-CM

## 2022-04-21 DIAGNOSIS — J301 Allergic rhinitis due to pollen: Secondary | ICD-10-CM

## 2022-04-21 DIAGNOSIS — R351 Nocturia: Secondary | ICD-10-CM

## 2022-04-21 DIAGNOSIS — D6489 Other specified anemias: Secondary | ICD-10-CM

## 2022-04-21 DIAGNOSIS — M1A9XX0 Chronic gout, unspecified, without tophus (tophi): Secondary | ICD-10-CM

## 2022-04-21 DIAGNOSIS — E7800 Pure hypercholesterolemia, unspecified: Secondary | ICD-10-CM | POA: Insufficient documentation

## 2022-04-21 MED ORDER — FLUTICASONE PROPIONATE 50 MCG/ACT NA SUSP: 1.0000 | Freq: Every day | NASAL | 2 refills | Status: DC

## 2022-04-21 NOTE — Progress Notes (Signed)
Established Patient Office Visit  Subjective:  Patient ID: Larry Parsons, male    DOB: 10-Sep-1940  Age: 82 y.o. MRN: [id number]  Chief Complaint  Patient presents with   Follow-up    3 month follow up    No new complaints, here for lab review and medication refills. Labs reviewed and notable for anemia with LDL at target and normal LFTs. Recently underwent workup for anemia. Reports that his dermatitis has failed to respond to treatment from dermatology and is scheduled to follow up with them for further options.      Past Medical History:  Diagnosis Date   Arthralgia    Arthritis    Asthma    childhood   COPD (chronic obstructive pulmonary disease) (HCC)    Elevated liver enzymes    Fatty liver    GERD (gastroesophageal reflux disease)    Hepatic fibrosis    Hepatitis C    Hypertension    Positive colorectal cancer screening using Cologuard test    Stroke [facility])    2006    Past Surgical History:  Procedure Laterality Date   COLONOSCOPY N/A 07/30/2021   Procedure: COLONOSCOPY;  Surgeon: Toledo, Benay Pike, MD;  Location: [facility] ENDOSCOPY;  Service: Gastroenterology;  Laterality: N/A;   COLONOSCOPY WITH PROPOFOL N/A 03/17/2017   Procedure: COLONOSCOPY WITH PROPOFOL;  Surgeon: Toledo, Benay Pike, MD;  Location: [facility] ENDOSCOPY;  Service: Gastroenterology;  Laterality: N/A;   COLONOSCOPY WITH PROPOFOL N/A 12/01/2018   Procedure: COLONOSCOPY WITH PROPOFOL;  Surgeon: Toledo, Benay Pike, MD;  Location: [facility] ENDOSCOPY;  Service: Gastroenterology;  Laterality: N/A;   FLEXIBLE SIGMOIDOSCOPY N/A 04/13/2017   Procedure: FLEXIBLE SIGMOIDOSCOPY;  Surgeon: Toledo, Benay Pike, MD;  Location: [facility] ENDOSCOPY;  Service: Gastroenterology;  Laterality: N/A;   FLEXIBLE SIGMOIDOSCOPY N/A 10/13/2017   Procedure: FLEXIBLE SIGMOIDOSCOPY;  Surgeon: Toledo, Benay Pike, MD;  Location: [facility] ENDOSCOPY;  Service: Gastroenterology;  Laterality: N/A;    Social History   Socioeconomic History   Marital status:  Married    Spouse name: Not on file   Number of children: Not on file   Years of education: Not on file   Highest education level: Not on file  Occupational History   Not on file  Tobacco Use   Smoking status: Never   Smokeless tobacco: Current    Types: Snuff  Vaping Use   Vaping Use: Never used  Substance and Sexual Activity   Alcohol use: Yes    Alcohol/week: 4.0 standard drinks of alcohol    Types: 4 Shots of liquor per week    Comment: 3x/week (1 beer) occassionally    Drug use: No   Sexual activity: Not on file  Other Topics Concern   Not on file  Social History Narrative   Not on file   Social Determinants of Health   Financial Resource Strain: Not on file  Food Insecurity: Not on file  Transportation Needs: Not on file  Physical Activity: Not on file  Stress: Not on file  Social Connections: Not on file  Intimate Partner Violence: Not on file    Family History  Problem Relation Age of Onset   Heart attack Father    Heart failure Father     Allergies  Allergen Reactions   Ace Inhibitors Swelling    Other reaction(Anaily Ashbaugh): Angioedema, Other (See Comments), Other (see comments) Other reaction(Myna Freimark): Other (See Comments) Angioedema Other reaction(Trisa Cranor): Other (See Comments) Angioedema Other reaction(Abdalrahman Clementson): Other (See Comments) Angioedema    Lisinopril Other (See Comments)  Angioedema   Shellfish Allergy Anaphylaxis   Penicillins Rash    Other reaction(Venus Ruhe): Other (See Comments) "hard spot" if injected Other reaction(Anjel Perfetti): Other (See Comments) "hard spot" if injected Other reaction(Essance Gatti): Other (See Comments) "hard spot" if injected Other reaction(Shandra Szymborski): Other (See Comments) "hard spot" if injected     Review of Systems  All other systems reviewed and are negative.      Objective:   BP (!) 140/90   Pulse 60   Ht '5\' 10"'$  (1.778 m)   Wt 161 lb 12.8 oz (73.4 kg)   SpO2 93%   BMI 23.22 kg/m   Vitals:   04/21/22 0952  BP: (!) 140/90  Pulse: 60  Height:  '5\' 10"'$  (1.778 m)  Weight: 161 lb 12.8 oz (73.4 kg)  SpO2: 93%  BMI (Calculated): 23.22    Physical Exam Vitals reviewed.  Constitutional:      Appearance: Normal appearance.  HENT:     Head: Normocephalic.     Left Ear: There is no impacted cerumen.     Nose: Nose normal.     Mouth/Throat:     Mouth: Mucous membranes are moist.     Pharynx: No posterior oropharyngeal erythema.  Eyes:     Extraocular Movements: Extraocular movements intact.     Pupils: Pupils are equal, round, and reactive to light.  Cardiovascular:     Rate and Rhythm: Regular rhythm.     Chest Wall: PMI is not displaced.     Pulses: Normal pulses.     Heart sounds: Normal heart sounds. No murmur heard. Pulmonary:     Effort: Pulmonary effort is normal.     Breath sounds: Normal air entry. No rhonchi or rales.  Abdominal:     General: Abdomen is flat. Bowel sounds are normal. There is no distension.     Palpations: Abdomen is soft. There is no hepatomegaly, splenomegaly or mass.     Tenderness: There is no abdominal tenderness.  Musculoskeletal:        General: Normal range of motion.     Cervical back: Normal range of motion and neck supple.     Right lower leg: No edema.     Left lower leg: No edema.  Skin:    General: Skin is warm and dry.     Comments: Dark,lichenified patches on his forearms and back  Neurological:     General: No focal deficit present.     Mental Status: He is alert and oriented to person, place, and time.     Cranial Nerves: No cranial nerve deficit.     Motor: No weakness.  Psychiatric:        Mood and Affect: Mood normal.        Behavior: Behavior normal.      No results found for any visits on 04/21/22.  Recent Results (from the past 2160 hour(Avriel Kandel))  CBC with Differential/Platelet     Status: Abnormal   Collection Time: 04/16/22  8:39 AM  Result Value Ref Range   WBC 5.3 3.4 - 10.8 x10E3/uL   RBC 4.09 (L) 4.14 - 5.80 x10E6/uL   Hemoglobin 12.8 (L) 13.0 - 17.7 g/dL    Hematocrit 39.2 37.5 - 51.0 %   MCV 96 79 - 97 fL   MCH 31.3 26.6 - 33.0 pg   MCHC 32.7 31.5 - 35.7 g/dL   RDW 12.4 11.6 - 15.4 %   Platelets 153 150 - 450 x10E3/uL   Neutrophils 53 Not Estab. %  Lymphs 20 Not Estab. %   Monocytes 11 Not Estab. %   Eos 15 Not Estab. %   Basos 1 Not Estab. %   Neutrophils Absolute 2.8 1.4 - 7.0 x10E3/uL   Lymphocytes Absolute 1.0 0.7 - 3.1 x10E3/uL   Monocytes Absolute 0.6 0.1 - 0.9 x10E3/uL   EOS (ABSOLUTE) 0.8 (H) 0.0 - 0.4 x10E3/uL   Basophils Absolute 0.0 0.0 - 0.2 x10E3/uL   Immature Granulocytes 0 Not Estab. %   Immature Grans (Abs) 0.0 0.0 - 0.1 x10E3/uL  Lipid Panel w/o Chol/HDL Ratio     Status: None   Collection Time: 04/16/22  8:39 AM  Result Value Ref Range   Cholesterol, Total 167 100 - 199 mg/dL   Triglycerides 66 0 - 149 mg/dL   HDL 71 >39 mg/dL   VLDL Cholesterol Cal 13 5 - 40 mg/dL   LDL Chol Calc (NIH) 83 0 - 99 mg/dL  Hepatic function panel     Status: None   Collection Time: 04/16/22  8:39 AM  Result Value Ref Range   Total Protein 6.5 6.0 - 8.5 g/dL   Albumin 4.0 3.7 - 4.7 g/dL   Bilirubin Total 0.7 0.0 - 1.2 mg/dL   Bilirubin, Direct 0.17 0.00 - 0.40 mg/dL   Alkaline Phosphatase 50 44 - 121 IU/L   AST 15 0 - 40 IU/L   ALT 9 0 - 44 IU/L  Uric acid     Status: None   Collection Time: 04/16/22  8:39 AM  Result Value Ref Range   Uric Acid 5.5 3.8 - 8.4 mg/dL    Comment:            Therapeutic target for gout patients: <6.0      Assessment & Plan:   Problem List Items Addressed This Visit       Respiratory   Chronic obstructive pulmonary disease (HCC)   Relevant Medications   fluticasone (FLONASE) 50 MCG/ACT nasal spray     Other   BPH associated with nocturia   Chronic gout without tophus   Pure hypercholesterolemia - Primary   Relevant Medications   EPINEPHrine 0.3 mg/0.3 mL IJ SOAJ injection   Other Relevant Orders   Comprehensive metabolic panel   Lipid panel   Other Visit Diagnoses      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 Pure hypercholesterolemia - Comprehensive metabolic panel; Future - Lipid panel; Future  2. Chronic gout without tophus, unspecified cause, unspecified site  3. Chronic obstructive pulmonary disease, unspecified COPD type ([place])  4. BPH associated with nocturia  5. 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  Dispense: 16 mL; Refill: 2  6. Anemia due to other cause, not classified - CBC With Diff/Platelet; Future    Return in about 3 months (around 07/22/2022) for awv with labs prior.   Total time spent: 30 minutes  Volanda Napoleon, MD  04/21/2022

## 2022-04-22 ENCOUNTER — Ambulatory Visit (INDEPENDENT_AMBULATORY_CARE_PROVIDER_SITE_OTHER): Payer: Medicare Other | Admitting: Dermatology

## 2022-04-22 ENCOUNTER — Encounter: Payer: Self-pay | Admitting: Dermatology

## 2022-04-22 VITALS — BP 150/90 | HR 54

## 2022-04-22 DIAGNOSIS — L819 Disorder of pigmentation, unspecified: Secondary | ICD-10-CM

## 2022-04-22 DIAGNOSIS — L299 Pruritus, unspecified: Secondary | ICD-10-CM

## 2022-04-22 DIAGNOSIS — Z79899 Other long term (current) drug therapy: Secondary | ICD-10-CM

## 2022-04-22 DIAGNOSIS — L209 Atopic dermatitis, unspecified: Secondary | ICD-10-CM

## 2022-04-22 DIAGNOSIS — L2081 Atopic neurodermatitis: Secondary | ICD-10-CM

## 2022-04-22 MED ORDER — DUPIXENT 300 MG/2ML ~~LOC~~ SOSY: 300.0000 mg | PREFILLED_SYRINGE | SUBCUTANEOUS | 0 refills | Status: DC

## 2022-04-22 MED ORDER — DUPILUMAB 300 MG/2ML ~~LOC~~ SOSY
300.0000 mg | PREFILLED_SYRINGE | Freq: Once | SUBCUTANEOUS | Status: AC
  Administered 2022-04-22: 300 mg via SUBCUTANEOUS

## 2022-04-22 MED ORDER — CLOBETASOL PROPIONATE 0.05 % EX CREA: 1.0000 | TOPICAL_CREAM | CUTANEOUS | 2 refills | Status: DC

## 2022-04-22 MED ORDER — EUCRISA 2 % EX OINT: 1.0000 | TOPICAL_OINTMENT | Freq: Every day | CUTANEOUS | 6 refills | Status: AC

## 2022-04-22 NOTE — Progress Notes (Signed)
Follow-Up Visit   Subjective  Larry Parsons is a 82 y.o. male who presents for the following: Eczema (Patient here for 2 week follow up, hx of flares at trunk and extremities. Currently on dupixent injections every 2 weeks, clobetasol cream as needed for flares and eucrisa qd. Patient reports still itchy and flared at arms and back. ).  The following portions of the chart were reviewed this encounter and updated as appropriate:  Tobacco  Allergies  Meds  Problems  Med Hx  Surg Hx  Fam Hx     Review of Systems: No other skin or systemic complaints except as noted in HPI or Assessment and Plan.  Objective  Well appearing patient in no apparent distress; mood and affect are within normal limits.  A focused examination was performed including trunk and extremities. Relevant physical exam findings are noted in the Assessment and Plan.   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 Condition is symptomatic / bothersome to patient.  Feb 2023 started dupixent -patient has done well and improved gradually over time.  Chronic and persistent condition with duration or expected duration over one year. Condition is bothersome/symptomatic for patient. Currently flared.  He seems to have flared in association with cooler weather and has some patches on his arms and also complains of itch.  Nevertheless he continues to overall be much better than his original state before starting Dupixent.  Patient approved for dupixent by insurance.  Instructed today to call pharmacy to have medication sent to either [place] or patient.  Sample given today    Continue Dupixent 300 mg/ 2 ml injections every 2 weeks. Patient is not interested in self  injection at this time. Cont Clobetasol cr qd up to 5d/wk to aa legs until clear, avoid f/g/a use only eczema rash f sa sun only  avoid face groin under arms  Continue Eucrisa cream to aa qd   Continue Dove Sensitive Skin Soap Continue Cerve Cream as daily moisturizer  Sample Injected today of Dupixent 300 mg/2 ml prefilled syringe  into patient's left arm. Patient tolerated well with no adverse reactions  NDC [id number] Exp 2024-01-09  Atopic dermatitis - Severe, on Dupixent (biologic medication).  Atopic dermatitis (eczema) is a chronic, relapsing, pruritic condition that can significantly affect quality of life. It is often associated with allergic rhinitis and/or asthma and can require treatment with topical medications, phototherapy, or in severe cases a biologic medication called Dupixent, which requires long term medication management.   Dupilumab (Dupixent) is a treatment given by injection for adults and children with moderate-to-severe atopic dermatitis. Goal is control of skin condition, not cure. It is given as 2 injections at the first dose followed by 1 injection ever 2 weeks thereafter.  Young children are dosed monthly.   Potential side effects include allergic reaction, herpes infections, injection site reactions and conjunctivitis (inflammation of the eyes).  The use of Dupixent requires long term medication management, including periodic office visits.    Topical steroids (such as triamcinolone, fluocinolone, fluocinonide, mometasone, clobetasol, halobetasol, betamethasone, hydrocortisone) can cause thinning and lightening of the skin if they are used for too long in the same area. Your physician has selected the right strength medicine for your problem and area affected on the body. Please  use your medication only as directed by your physician to prevent side effects.   Related Medications  Crisaborole (EUCRISA) 2 % OINT - trunk and extremities Apply 1  Application topically daily. For rash at body (atopic dermatitis) dupilumab (DUPIXENT) prefilled syringe 300 mg - trunk and extremities  Atopic neurodermatitis Related Medications clobetasol cream (TEMOVATE) [id number] % Apply 1 Application topically as directed. Qd up to 5 days a week to eczema on body, avoid face, groin, axilla  Return for 2 week nurse dupixent shot, 2 week nurse dupixent , 4 month atopic derm follow up with Dr. Dara Lords, Ruthell Rummage, CMA, am acting as scribe for Sarina Ser, MD. Documentation: I have reviewed the above documentation for accuracy and completeness, and I agree with the above.  Sarina Ser, MD

## 2022-04-22 NOTE — Patient Instructions (Addendum)
For itchy areas   Continue Eucrisa Cream apply to any itchy areas daily   Continue clobetasol cream apply to flared areas of body twice daily on weekends only. Avoid applying to face, groin, and axilla. Use as directed. Long-term use can cause thinning of the skin.  Topical steroids (such as triamcinolone, fluocinolone, fluocinonide, mometasone, clobetasol, halobetasol, betamethasone, hydrocortisone) can cause thinning and lightening of the skin if they are used for too long in the same area. Your physician has selected the right strength medicine for your problem and area affected on the body. Please use your medication only as directed by your physician to prevent side effects.      Dupilumab (Dupixent) is a treatment given by injection for adults and children with moderate-to-severe atopic dermatitis. Goal is control of skin condition, not cure. It is given as 2 injections at the first dose followed by 1 injection ever 2 weeks thereafter.  Young children are dosed monthly.  Potential side effects include allergic reaction, herpes infections, injection site reactions and conjunctivitis (inflammation of the eyes).  The use of Dupixent requires long term medication management, including periodic office visits.    Due to recent changes in healthcare laws, you may see results of your pathology and/or laboratory studies on MyChart before the doctors have had a chance to review them. We understand that in some cases there may be results that are confusing or concerning to you. Please understand that not all results are received at the same time and often the doctors may need to interpret multiple results in order to provide you with the best plan of care or course of treatment. Therefore, we ask that you please give [place] 2 business days to thoroughly review all your results before contacting the office for clarification. Should we see a critical lab result, you will be contacted sooner.   If You Need  Anything After Your Visit  If you have any questions or concerns for your doctor, please call our main line at [phone] and press option 4 to reach your doctor's medical assistant. If no one answers, please leave a voicemail as directed and we will return your call as soon as possible. Messages left after 4 pm will be answered the following business day.   You may also send [place] a message via [place]. We typically respond to MyChart messages within 1-2 business days.  For prescription refills, please ask your pharmacy to contact our office. Our fax number is [phone].  If you have an urgent issue when the clinic is closed that cannot wait until the next business day, you can page your doctor at the number below.    Please note that while we do our best to be available for urgent issues outside of office hours, we are not available 24/7.   If you have an urgent issue and are unable to reach [place], you may choose to seek medical care at your doctor's office, retail clinic, urgent care center, or emergency room.  If you have a medical emergency, please immediately call 911 or go to the emergency department.  Pager Numbers  - Dr. Nehemiah Massed: [phone]  - Dr. Laurence Ferrari: [phone]  - Dr. Nicole Kindred: [phone]  In the event of inclement weather, please call our main line at [phone] for an update on the status of any delays or closures.  Dermatology Medication Tips: Please keep the boxes that topical medications come in in order to help keep track of the instructions about where and how to  use these. Pharmacies typically print the medication instructions only on the boxes and not directly on the medication tubes.   If your medication is too expensive, please contact our office at [phone] option 4 or send [place] a message through [place].   We are unable to tell what your co-pay for medications will be in advance as this is different depending on your insurance coverage. However, we may  be able to find a substitute medication at lower cost or fill out paperwork to get insurance to cover a needed medication.   If a prior authorization is required to get your medication covered by your insurance company, please allow [place] 1-2 business days to complete this process.  Drug prices often vary depending on where the prescription is filled and some pharmacies may offer cheaper prices.  The website www.goodrx.com contains coupons for medications through different pharmacies. The prices here do not account for what the cost may be with help from insurance (it may be cheaper with your insurance), but the website can give you the price if you did not use any insurance.  - You can print the associated coupon and take it with your prescription to the pharmacy.  - You may also stop by our office during regular business hours and pick up a GoodRx coupon card.  - If you need your prescription sent electronically to a different pharmacy, notify our office through [facility] or by phone at [phone] option 4.     Si Usted Necesita Algo Despus de Su Visita  Tambin puede enviarnos un mensaje a travs de Pharmacist, community. Por lo general respondemos a los mensajes de MyChart en el transcurso de 1 a 2 das hbiles.  Para renovar recetas, por favor pida a su farmacia que se ponga en contacto con nuestra oficina. Harland Dingwall de fax es [place] [phone].  Si tiene un asunto urgente cuando la clnica est cerrada y que no puede esperar hasta el siguiente da hbil, puede llamar/localizar a su doctor(a) al nmero que aparece a continuacin.   Por favor, tenga en cuenta que aunque hacemos todo lo posible para estar disponibles para asuntos urgentes fuera del horario de [place], no estamos disponibles las 24 horas del da, los 7 das de la [place].   Si tiene un problema urgente y no puede comunicarse con nosotros, puede optar por buscar atencin mdica  en el consultorio de su doctor(a), en una  clnica privada, en un centro de atencin urgente o en una sala de emergencias.  Si tiene Engineering geologist, por favor llame inmediatamente al 911 o vaya a la sala de emergencias.  Nmeros de bper  - Dr. Nehemiah Massed: [phone]  - Dra. Moye: [phone]  - Dra. Nicole Kindred: [phone]  En caso de inclemencias del [place], por favor llame a Johnsie Kindred principal al [phone] para una actualizacin sobre el [place] de cualquier retraso o cierre.  Consejos para la medicacin en dermatologa: Por favor, guarde las cajas en las que vienen los medicamentos de uso tpico para ayudarle a seguir las instrucciones sobre dnde y cmo usarlos. Las farmacias generalmente imprimen las instrucciones del medicamento slo en las cajas y no directamente en los tubos del [place].   Si su medicamento es muy caro, por favor, pngase en contacto con Zigmund Daniel llamando al [phone] y presione la opcin 4 o envenos un mensaje a travs de Pharmacist, community.   No podemos decirle cul ser su copago por los medicamentos por adelantado ya que esto es diferente dependiendo de  la cobertura de su seguro. Sin embargo, es posible que podamos encontrar un medicamento sustituto a Electrical engineer un formulario para que el seguro cubra el medicamento que se considera necesario.   Si se requiere una autorizacin previa para que su compaa de seguros [place] su medicamento, por favor permtanos de 1 a 2 das hbiles para completar este proceso.  Los precios de los medicamentos varan con frecuencia dependiendo del Environmental consultant de dnde se surte la receta y alguna farmacias pueden ofrecer precios ms baratos.  El sitio web www.goodrx.com tiene cupones para medicamentos de Airline pilot. Los precios aqu no tienen en cuenta lo que podra costar con la ayuda del seguro (puede ser ms barato con su seguro), pero el sitio web puede darle el precio si no utiliz Research scientist (physical sciences).  - Puede imprimir el cupn correspondiente y  llevarlo con su receta a la farmacia.  - Tambin puede pasar por nuestra oficina durante el horario de atencin regular y Charity fundraiser una tarjeta de cupones de GoodRx.  - Si necesita que su receta se enve electrnicamente a una farmacia diferente, informe a nuestra oficina a travs de MyChart de [place] o por telfono llamando al [phone] y presione la opcin 4.

## 2022-04-23 ENCOUNTER — Other Ambulatory Visit: Payer: Self-pay | Admitting: Internal Medicine

## 2022-04-23 DIAGNOSIS — N400 Enlarged prostate without lower urinary tract symptoms: Secondary | ICD-10-CM

## 2022-04-24 ENCOUNTER — Encounter: Payer: Self-pay | Admitting: Dermatology

## 2022-05-06 ENCOUNTER — Ambulatory Visit (INDEPENDENT_AMBULATORY_CARE_PROVIDER_SITE_OTHER): Payer: Medicare Other

## 2022-05-06 DIAGNOSIS — L209 Atopic dermatitis, unspecified: Secondary | ICD-10-CM

## 2022-05-06 MED ORDER — DUPILUMAB 300 MG/2ML ~~LOC~~ SOSY
300.0000 mg | PREFILLED_SYRINGE | Freq: Once | SUBCUTANEOUS | Status: AC
  Administered 2022-05-06: 300 mg via SUBCUTANEOUS

## 2022-05-06 NOTE — Progress Notes (Signed)
Patient here today for two week Dupixent injection for severe Atopic Dermatitis.    Dupixent 300mg  injected into patient right upper arm. Patient tolerated well.     LOT: [id number] EXP: 05/2024 NDC: [id number]   Johnsie Kindred, RMA

## 2022-05-20 ENCOUNTER — Ambulatory Visit (INDEPENDENT_AMBULATORY_CARE_PROVIDER_SITE_OTHER): Payer: Medicare Other

## 2022-05-20 DIAGNOSIS — L209 Atopic dermatitis, unspecified: Secondary | ICD-10-CM

## 2022-05-20 MED ORDER — DUPILUMAB 300 MG/2ML ~~LOC~~ SOSY
300.0000 mg | PREFILLED_SYRINGE | Freq: Once | SUBCUTANEOUS | Status: AC
  Administered 2022-05-20: 300 mg via SUBCUTANEOUS

## 2022-05-20 NOTE — Progress Notes (Signed)
Patient here today for two week Dupixent injection for severe Atopic Dermatitis.    Dupixent 300mg  injected into patient left upper arm. Patient tolerated well.     [id number] EXP: 11/2022 NDC: 5035-4656-81   Evorn Gong, RMA

## 2022-05-30 ENCOUNTER — Other Ambulatory Visit: Payer: Self-pay | Admitting: Internal Medicine

## 2022-05-30 DIAGNOSIS — J309 Allergic rhinitis, unspecified: Secondary | ICD-10-CM

## 2022-05-30 DIAGNOSIS — I10 Essential (primary) hypertension: Secondary | ICD-10-CM

## 2022-06-03 ENCOUNTER — Ambulatory Visit (INDEPENDENT_AMBULATORY_CARE_PROVIDER_SITE_OTHER): Payer: Medicare Other

## 2022-06-03 DIAGNOSIS — L209 Atopic dermatitis, unspecified: Secondary | ICD-10-CM | POA: Diagnosis not present

## 2022-06-03 MED ORDER — DUPILUMAB 300 MG/2ML ~~LOC~~ SOSY
300.0000 mg | PREFILLED_SYRINGE | Freq: Once | SUBCUTANEOUS | Status: AC
  Administered 2022-06-03: 300 mg via SUBCUTANEOUS

## 2022-06-03 NOTE — Progress Notes (Signed)
Patient here today for two week Dupixent injection for severe Atopic Dermatitis.    Dupixent  injected into patient right upper arm. Patient tolerated well.     LOT: [id number] EXP: 06/2024 NDC: 3244-0102-72   Dorathy Daft, RMA

## 2022-06-17 ENCOUNTER — Ambulatory Visit (INDEPENDENT_AMBULATORY_CARE_PROVIDER_SITE_OTHER): Payer: Medicare Other

## 2022-06-17 DIAGNOSIS — L209 Atopic dermatitis, unspecified: Secondary | ICD-10-CM

## 2022-06-17 MED ORDER — DUPILUMAB 300 MG/2ML ~~LOC~~ SOSY
300.0000 mg | PREFILLED_SYRINGE | Freq: Once | SUBCUTANEOUS | Status: AC
  Administered 2022-06-17: 300 mg via SUBCUTANEOUS

## 2022-06-17 NOTE — Progress Notes (Signed)
Patient here today for two week Dupixent injection for severe Atopic Dermatitis.    Dupixent 300mg  injected into patient left upper arm. Patient tolerated well.     LOT: [id number] EXP: 06/2024 NDC: 9147-8295-62  Sabino Denning RMA

## 2022-06-24 ENCOUNTER — Ambulatory Visit: Payer: Medicare Other | Admitting: Dermatology

## 2022-07-01 ENCOUNTER — Ambulatory Visit (INDEPENDENT_AMBULATORY_CARE_PROVIDER_SITE_OTHER): Payer: Medicare Other

## 2022-07-01 DIAGNOSIS — L209 Atopic dermatitis, unspecified: Secondary | ICD-10-CM

## 2022-07-01 MED ORDER — DUPILUMAB 300 MG/2ML ~~LOC~~ SOSY
300.0000 mg | PREFILLED_SYRINGE | Freq: Once | SUBCUTANEOUS | Status: AC
  Administered 2022-07-01: 300 mg via SUBCUTANEOUS

## 2022-07-01 NOTE — Progress Notes (Signed)
Patient here today for two week Dupixent injection for severe Atopic Dermatitis.    Dupixent 300mg  injected into patient right upper arm. Patient tolerated well.  Patient supplied medication.    LOT: [id number] EXP: 08/2024 NDC: 0454-0981-19   Qais Jowers RMA

## 2022-07-15 ENCOUNTER — Ambulatory Visit (INDEPENDENT_AMBULATORY_CARE_PROVIDER_SITE_OTHER): Payer: Medicare Other

## 2022-07-15 DIAGNOSIS — L209 Atopic dermatitis, unspecified: Secondary | ICD-10-CM | POA: Diagnosis not present

## 2022-07-15 MED ORDER — DUPILUMAB 300 MG/2ML ~~LOC~~ SOAJ
300.0000 mg | Freq: Once | SUBCUTANEOUS | Status: AC
  Administered 2022-07-15: 300 mg via SUBCUTANEOUS

## 2022-07-15 NOTE — Progress Notes (Signed)
Patient here today for two week Dupixent injection for severe Atopic Dermatitis.    Dupixent 300mg  injected into patient left upper arm. Patient tolerated well. Patient supplied medication.    LOT: [id number] EXP: 05/09/2024 NDC: 7846-9629-52   Cari Caraway CMA, AAMA

## 2022-07-17 ENCOUNTER — Other Ambulatory Visit: Payer: Medicare Other

## 2022-07-17 DIAGNOSIS — D6489 Other specified anemias: Secondary | ICD-10-CM

## 2022-07-17 DIAGNOSIS — E7800 Pure hypercholesterolemia, unspecified: Secondary | ICD-10-CM

## 2022-07-18 LAB — CBC WITH DIFF/PLATELET
Basophils Absolute: 0 10*3/uL (ref 0.0–0.2)
Basos: 1 %
EOS (ABSOLUTE): 0.3 10*3/uL (ref 0.0–0.4)
Eos: 7 %
Hematocrit: 39.4 % (ref 37.5–51.0)
Hemoglobin: 12.8 g/dL — ABNORMAL LOW (ref 13.0–17.7)
Immature Grans (Abs): 0 10*3/uL (ref 0.0–0.1)
Immature Granulocytes: 0 %
Lymphocytes Absolute: 1.2 10*3/uL (ref 0.7–3.1)
Lymphs: 24 %
MCH: 30.2 pg (ref 26.6–33.0)
MCHC: 32.5 g/dL (ref 31.5–35.7)
MCV: 93 fL (ref 79–97)
Monocytes Absolute: 0.5 10*3/uL (ref 0.1–0.9)
Monocytes: 10 %
Neutrophils Absolute: 2.9 10*3/uL (ref 1.4–7.0)
Neutrophils: 58 %
Platelets: 147 10*3/uL — ABNORMAL LOW (ref 150–450)
RBC: 4.24 x10E6/uL (ref 4.14–5.80)
RDW: 11.9 % (ref 11.6–15.4)
WBC: 4.9 10*3/uL (ref 3.4–10.8)

## 2022-07-18 LAB — COMPREHENSIVE METABOLIC PANEL
ALT: 7 IU/L (ref 0–44)
AST: 14 IU/L (ref 0–40)
Albumin/Globulin Ratio: 1.5 (ref 1.2–2.2)
Albumin: 4 g/dL (ref 3.7–4.7)
Alkaline Phosphatase: 54 IU/L (ref 44–121)
BUN/Creatinine Ratio: 12 (ref 10–24)
BUN: 13 mg/dL (ref 8–27)
Bilirubin Total: 0.5 mg/dL (ref 0.0–1.2)
CO2: 32 mmol/L — ABNORMAL HIGH (ref 20–29)
Calcium: 9.7 mg/dL (ref 8.6–10.2)
Chloride: 100 mmol/L (ref 96–106)
Creatinine, Ser: 1.11 mg/dL (ref 0.76–1.27)
Globulin, Total: 2.6 g/dL (ref 1.5–4.5)
Glucose: 90 mg/dL (ref 70–99)
Potassium: 4.1 mmol/L (ref 3.5–5.2)
Sodium: 142 mmol/L (ref 134–144)
Total Protein: 6.6 g/dL (ref 6.0–8.5)
eGFR: 66 mL/min/{1.73_m2} (ref >59)

## 2022-07-18 LAB — LIPID PANEL
Chol/HDL Ratio: 2.3 ratio (ref 0.0–5.0)
Cholesterol, Total: 181 mg/dL (ref 100–199)
HDL: 79 mg/dL (ref >39)
LDL Chol Calc (NIH): 92 mg/dL (ref 0–99)
Triglycerides: 53 mg/dL (ref 0–149)
VLDL Cholesterol Cal: 10 mg/dL (ref 5–40)

## 2022-07-22 ENCOUNTER — Ambulatory Visit (INDEPENDENT_AMBULATORY_CARE_PROVIDER_SITE_OTHER): Payer: Medicare Other | Admitting: Internal Medicine

## 2022-07-22 ENCOUNTER — Encounter: Payer: Self-pay | Admitting: Internal Medicine

## 2022-07-22 VITALS — BP 132/78 | HR 53 | Ht 69.0 in | Wt 158.2 lb

## 2022-07-22 DIAGNOSIS — J301 Allergic rhinitis due to pollen: Secondary | ICD-10-CM

## 2022-07-22 DIAGNOSIS — Z0001 Encounter for general adult medical examination with abnormal findings: Secondary | ICD-10-CM | POA: Diagnosis not present

## 2022-07-22 DIAGNOSIS — E7800 Pure hypercholesterolemia, unspecified: Secondary | ICD-10-CM

## 2022-07-22 MED ORDER — FLUTICASONE PROPIONATE 50 MCG/ACT NA SUSP
1.0000 | Freq: Every day | NASAL | 2 refills | Status: DC
Start: 2022-07-22 — End: 2022-10-27

## 2022-07-22 MED ORDER — FEXOFENADINE HCL 180 MG PO TABS
180.0000 mg | ORAL_TABLET | Freq: Every day | ORAL | 1 refills | Status: DC
Start: 2022-07-22 — End: 2022-10-27

## 2022-07-22 NOTE — Progress Notes (Signed)
Established Patient Office Visit  Subjective:  Patient ID: Larry Parsons, male    DOB: 1940/11/16  Age: 82 y.o. MRN: [id number]  Chief Complaint  Patient presents with   Annual Exam    AWV, 3 month follow up, discuss lab results.    Only c/o flare of seasonal allergies. Here for AWV refer to quality metrics and scanned documents.     No other concerns at this time.   Past Medical History:  Diagnosis Date   Arthralgia    Arthritis    Asthma    childhood   COPD (chronic obstructive pulmonary disease) (HCC)    Elevated liver enzymes    Fatty liver    GERD (gastroesophageal reflux disease)    Hepatic fibrosis    Hepatitis C    Hypertension    Positive colorectal cancer screening using Cologuard test    Stroke [facility])    2006    Past Surgical History:  Procedure Laterality Date   COLONOSCOPY N/A 07/30/2021   Procedure: COLONOSCOPY;  Surgeon: Toledo, Boykin Nearing, MD;  Location: [facility] ENDOSCOPY;  Service: Gastroenterology;  Laterality: N/A;   COLONOSCOPY WITH PROPOFOL N/A 03/17/2017   Procedure: COLONOSCOPY WITH PROPOFOL;  Surgeon: Toledo, Boykin Nearing, MD;  Location: [facility] ENDOSCOPY;  Service: Gastroenterology;  Laterality: N/A;   COLONOSCOPY WITH PROPOFOL N/A 12/01/2018   Procedure: COLONOSCOPY WITH PROPOFOL;  Surgeon: Toledo, Boykin Nearing, MD;  Location: [facility] ENDOSCOPY;  Service: Gastroenterology;  Laterality: N/A;   FLEXIBLE SIGMOIDOSCOPY N/A 04/13/2017   Procedure: FLEXIBLE SIGMOIDOSCOPY;  Surgeon: Toledo, Boykin Nearing, MD;  Location: [facility] ENDOSCOPY;  Service: Gastroenterology;  Laterality: N/A;   FLEXIBLE SIGMOIDOSCOPY N/A 10/13/2017   Procedure: FLEXIBLE SIGMOIDOSCOPY;  Surgeon: Toledo, Boykin Nearing, MD;  Location: [facility] ENDOSCOPY;  Service: Gastroenterology;  Laterality: N/A;    Social History   Socioeconomic History   Marital status: Married    Spouse name: Not on file   Number of children: Not on file   Years of education: Not on file   Highest education level: Not on file   Occupational History   Not on file  Tobacco Use   Smoking status: Never   Smokeless tobacco: Current    Types: Snuff  Vaping Use   Vaping Use: Never used  Substance and Sexual Activity   Alcohol use: Yes    Alcohol/week: 4.0 standard drinks of alcohol    Types: 4 Shots of liquor per week    Comment: 3x/week (1 beer) occassionally    Drug use: No   Sexual activity: Not on file  Other Topics Concern   Not on file  Social History Narrative   Not on file   Social Determinants of Health   Financial Resource Strain: Not on file  Food Insecurity: Not on file  Transportation Needs: Not on file  Physical Activity: Not on file  Stress: Not on file  Social Connections: Not on file  Intimate Partner Violence: Not on file    Family History  Problem Relation Age of Onset   Heart attack Father    Heart failure Father     Allergies  Allergen Reactions   Ace Inhibitors Swelling    Other reaction(Claiborne Stroble): Angioedema, Other (See Comments), Other (see comments) Other reaction(Marysue Fait): Other (See Comments) Angioedema Other reaction(Caidon Foti): Other (See Comments) Angioedema Other reaction(Jermanie Minshall): Other (See Comments) Angioedema    Lisinopril Other (See Comments)    Angioedema   Shellfish Allergy Anaphylaxis   Penicillins Rash    Other reaction(Rawley Harju): Other (See Comments) "hard spot"  if injected Other reaction(Aleighna Wojtas): Other (See Comments) "hard spot" if injected Other reaction(Taleigh Gero): Other (See Comments) "hard spot" if injected Other reaction(Hendrick Pavich): Other (See Comments) "hard spot" if injected     Review of Systems  Constitutional: Negative.   HENT: Negative.    Eyes: Negative.   Respiratory: Negative.    Cardiovascular: Negative.   Gastrointestinal: Negative.   Genitourinary: Negative.   Skin: Negative.   Neurological: Negative.   Endo/Heme/Allergies: Negative.        Objective:   BP 132/78   Pulse (!) 53   Ht 5\' 9"  (1.753 m)   Wt 158 lb 3.2 oz (71.8 kg)   SpO2 94%   BMI 23.36 kg/m    Vitals:   07/22/22 0927  BP: 132/78  Pulse: (!) 53  Height: 5\' 9"  (1.753 m)  Weight: 158 lb 3.2 oz (71.8 kg)  SpO2: 94%  BMI (Calculated): 23.35    Physical Exam Vitals reviewed.  Constitutional:      Appearance: Normal appearance.  HENT:     Head: Normocephalic.     Left Ear: There is no impacted cerumen.     Nose: Nose normal.     Mouth/Throat:     Mouth: Mucous membranes are moist.     Pharynx: No posterior oropharyngeal erythema.  Eyes:     Extraocular Movements: Extraocular movements intact.     Pupils: Pupils are equal, round, and reactive to light.  Cardiovascular:     Rate and Rhythm: Regular rhythm.     Chest Wall: PMI is not displaced.     Pulses: Normal pulses.     Heart sounds: Normal heart sounds. No murmur heard. Pulmonary:     Effort: Pulmonary effort is normal.     Breath sounds: Normal air entry. No rhonchi or rales.  Abdominal:     General: Abdomen is flat. Bowel sounds are normal. There is no distension.     Palpations: Abdomen is soft. There is no hepatomegaly, splenomegaly or mass.     Tenderness: There is no abdominal tenderness.  Musculoskeletal:        General: Normal range of motion.     Cervical back: Normal range of motion and neck supple.     Right lower leg: No edema.     Left lower leg: No edema.  Skin:    General: Skin is warm and dry.  Neurological:     General: No focal deficit present.     Mental Status: He is alert and oriented to person, place, and time.     Cranial Nerves: No cranial nerve deficit.     Motor: No weakness.  Psychiatric:        Mood and Affect: Mood normal.        Behavior: Behavior normal.      No results found for any visits on 07/22/22.  Recent Results (from the past 2160 hour(Claudia Greenley))  Lipid panel     Status: None   Collection Time: 07/17/22  9:07 AM  Result Value Ref Range   Cholesterol, Total 181 100 - 199 mg/dL   Triglycerides 53 0 - 149 mg/dL   HDL 79 >78 mg/dL   VLDL Cholesterol Cal 10 5 - 40  mg/dL   LDL Chol Calc (NIH) 92 0 - 99 mg/dL   Chol/HDL Ratio 2.3 0.0 - 5.0 ratio    Comment:  T. Chol/HDL Ratio                                             Men  Women                               1/2 Avg.Risk  3.4    3.3                                   Avg.Risk  5.0    4.4                                2X Avg.Risk  9.6    7.1                                3X Avg.Risk 23.4   11.0   Comprehensive metabolic panel     Status: Abnormal   Collection Time: 07/17/22  9:07 AM  Result Value Ref Range   Glucose 90 70 - 99 mg/dL   BUN 13 8 - 27 mg/dL   Creatinine, Ser 1.61 0.76 - 1.27 mg/dL   eGFR 66 >09 UE/AVW/0.98   BUN/Creatinine Ratio 12 10 - 24   Sodium 142 134 - 144 mmol/L   Potassium 4.1 3.5 - 5.2 mmol/L   Chloride 100 96 - 106 mmol/L   CO2 32 (H) 20 - 29 mmol/L   Calcium 9.7 8.6 - 10.2 mg/dL   Total Protein 6.6 6.0 - 8.5 g/dL   Albumin 4.0 3.7 - 4.7 g/dL   Globulin, Total 2.6 1.5 - 4.5 g/dL   Albumin/Globulin Ratio 1.5 1.2 - 2.2   Bilirubin Total 0.5 0.0 - 1.2 mg/dL   Alkaline Phosphatase 54 44 - 121 IU/L   AST 14 0 - 40 IU/L   ALT 7 0 - 44 IU/L  CBC With Diff/Platelet     Status: Abnormal   Collection Time: 07/17/22  9:07 AM  Result Value Ref Range   WBC 4.9 3.4 - 10.8 x10E3/uL   RBC 4.24 4.14 - 5.80 x10E6/uL   Hemoglobin 12.8 (L) 13.0 - 17.7 g/dL   Hematocrit 11.9 14.7 - 51.0 %   MCV 93 79 - 97 fL   MCH 30.2 26.6 - 33.0 pg   MCHC 32.5 31.5 - 35.7 g/dL   RDW 82.9 56.2 - 13.0 %   Platelets 147 (L) 150 - 450 x10E3/uL   Neutrophils 58 Not Estab. %   Lymphs 24 Not Estab. %   Monocytes 10 Not Estab. %   Eos 7 Not Estab. %   Basos 1 Not Estab. %   Neutrophils Absolute 2.9 1.4 - 7.0 x10E3/uL   Lymphocytes Absolute 1.2 0.7 - 3.1 x10E3/uL   Monocytes Absolute 0.5 0.1 - 0.9 x10E3/uL   EOS (ABSOLUTE) 0.3 0.0 - 0.4 x10E3/uL   Basophils Absolute 0.0 0.0 - 0.2 x10E3/uL   Immature Granulocytes 0 Not Estab. %   Immature Grans (Abs) 0.0 0.0 -  0.1 x10E3/uL      Assessment & Plan:   As per problem list  Problem List Items Addressed This Visit  Other   Pure hypercholesterolemia - Primary   Relevant Orders   CK   Lipid panel   Hepatic function panel   Other Visit Diagnoses     Non-seasonal allergic rhinitis due to pollen       Relevant Medications   fluticasone (FLONASE) 50 MCG/ACT nasal spray   fexofenadine (ALLEGRA) 180 MG tablet       Return in about 3 months (around 10/22/2022) for lab results.   Total time spent: 35 minutes  Luna Fuse, MD  07/22/2022   This document may have been prepared by [facility] Voice Recognition software and as such may include unintentional dictation errors.

## 2022-07-29 ENCOUNTER — Ambulatory Visit (INDEPENDENT_AMBULATORY_CARE_PROVIDER_SITE_OTHER): Payer: Medicare Other

## 2022-07-29 DIAGNOSIS — L209 Atopic dermatitis, unspecified: Secondary | ICD-10-CM

## 2022-07-29 MED ORDER — DUPILUMAB 300 MG/2ML ~~LOC~~ SOSY
300.0000 mg | PREFILLED_SYRINGE | Freq: Once | SUBCUTANEOUS | Status: AC
Start: 2022-07-29 — End: 2022-07-29
  Administered 2022-07-29: 300 mg via SUBCUTANEOUS

## 2022-07-29 NOTE — Progress Notes (Addendum)
Patient here today for two week Dupixent injection for severe Atopic Dermatitis.    Dupixent 300mg  injected into patient left upper arm. Patient tolerated well.     LOT: [id number] EXP: 08/2024 NDC: 0454-0981-19   Dorathy Daft, RMA

## 2022-08-12 ENCOUNTER — Ambulatory Visit: Payer: Medicare Other

## 2022-08-19 ENCOUNTER — Encounter: Payer: Self-pay | Admitting: Dermatology

## 2022-08-19 ENCOUNTER — Ambulatory Visit (INDEPENDENT_AMBULATORY_CARE_PROVIDER_SITE_OTHER): Payer: Medicare Other | Admitting: Dermatology

## 2022-08-19 VITALS — BP 124/84 | HR 65

## 2022-08-19 DIAGNOSIS — Z79899 Other long term (current) drug therapy: Secondary | ICD-10-CM

## 2022-08-19 DIAGNOSIS — L819 Disorder of pigmentation, unspecified: Secondary | ICD-10-CM | POA: Diagnosis not present

## 2022-08-19 DIAGNOSIS — L299 Pruritus, unspecified: Secondary | ICD-10-CM

## 2022-08-19 DIAGNOSIS — L209 Atopic dermatitis, unspecified: Secondary | ICD-10-CM | POA: Diagnosis not present

## 2022-08-19 MED ORDER — DUPILUMAB 300 MG/2ML ~~LOC~~ SOSY
300.0000 mg | PREFILLED_SYRINGE | Freq: Once | SUBCUTANEOUS | Status: AC
Start: 2022-08-19 — End: 2022-08-19
  Administered 2022-08-19: 300 mg via SUBCUTANEOUS

## 2022-08-19 NOTE — Progress Notes (Signed)
   Follow-Up Visit   Subjective  Larry Parsons is a 82 y.o. male who presents for the following: Atopic Dermatitis 4 month atopic derm follow up. Patient reports he is less itchy and no new flares at affected areas while on dupixent.   The following portions of the chart were reviewed this encounter and updated as appropriate: medications, allergies, medical history  Review of Systems:  No other skin or systemic complaints except as noted in HPI or Assessment and Plan.  Objective  Well appearing patient in no apparent distress; mood and affect are within normal limits.  Areas Examined: Arms, back,  Relevant physical exam findings are noted in the Assessment and Plan.   Assessment & Plan   Atopic dermatitis, unspecified type  Related Medications Crisaborole (EUCRISA) 2 % OINT Apply 1 Application topically daily. For rash at body (atopic dermatitis)  dupilumab (DUPIXENT) prefilled syringe 300 mg   Dyschromia  Pruritus  Medication management  Long-term use of high-risk medication  ATOPIC DERMATITIS Pruritus much improved Trunk and extremities  Exam: hyperpigmentation and scaly patches on trunk ; especially back.  Chronic and persistent condition with duration or expected duration over one year. Condition is symptomatic/ bothersome to patient. Not currently at goal. But much improved   Atopic dermatitis (eczema) is a chronic, relapsing, pruritic condition that can significantly affect quality of life. It is often associated with allergic rhinitis and/or asthma and can require treatment with topical medications, phototherapy, or in severe cases biologic injectable medication (Dupixent; Adbry) or Oral JAK inhibitors.  Treatment Plan: Continue Dupixent 300 mg/56ml  SQ every 2 weeks  Patient injected today with Dupixent 300 mg/ 2 ml into right upper arm. Patient tolerated well with no adverse reactions.  NDC: 6962-9528-41 Lot: [id number] Exp 08/2024  Continue clobetasol cream  qd as needed for flares 3 days weekly Friday, Saturday, Sunday. If no active areas do not use. Avoid applying to face, groin, and axilla. Use as directed. Long-term use can cause thinning of the skin.  Continue Eucrisa cream to aa qd / bid 7 days a week  If no active areas do not have to use cream medication  Long term medication management.  Patient is using long term (months to years) prescription medication  to control their dermatologic condition.  These medications require periodic monitoring to evaluate for efficacy and side effects and may require periodic laboratory monitoring.  Topical steroids (such as triamcinolone, fluocinolone, fluocinonide, mometasone, clobetasol, halobetasol, betamethasone, hydrocortisone) can cause thinning and lightening of the skin if they are used for too long in the same area. Your physician has selected the right strength medicine for your problem and area affected on the body. Please use your medication only as directed by your physician to prevent side effects.   Recommend gentle skin care.  Return for 2 week dupixent nurse vistit , january - february atopic derm follow up.  IAsher Muir, CMA, am acting as scribe for Armida Sans, MD.  Documentation: I have reviewed the above documentation for accuracy and completeness, and I agree with the above.  Armida Sans, MD

## 2022-08-19 NOTE — Patient Instructions (Addendum)
Continue clobetasol cream daily as needed for flares 3 days weekly Friday, Saturday, Sunday. If no active areas do not use. Avoid applying to face, groin, and axilla. Use as directed. Long-term use can cause thinning of the skin.  Continue Eucrisa cream to affected areas daily to twice daily 7 days weekly.  If not flared do not have to use any of the creams    Topical steroids (such as triamcinolone, fluocinolone, fluocinonide, mometasone, clobetasol, halobetasol, betamethasone, hydrocortisone) can cause thinning and lightening of the skin if they are used for too long in the same area. Your physician has selected the right strength medicine for your problem and area affected on the body. Please use your medication only as directed by your physician to prevent side effects.      Dupilumab (Dupixent) is a treatment given by injection for adults and children with moderate-to-severe atopic dermatitis. Goal is control of skin condition, not cure. It is given as 2 injections at the first dose followed by 1 injection ever 2 weeks thereafter.  Young children are dosed monthly.  Potential side effects include allergic reaction, herpes infections, injection site reactions and conjunctivitis (inflammation of the eyes).  The use of Dupixent requires long term medication management, including periodic office visits.        Due to recent changes in healthcare laws, you may see results of your pathology and/or laboratory studies on MyChart before the doctors have had a chance to review them. We understand that in some cases there may be results that are confusing or concerning to you. Please understand that not all results are received at the same time and often the doctors may need to interpret multiple results in order to provide you with the best plan of care or course of treatment. Therefore, we ask that you please give [place] 2 business days to thoroughly review all your results before contacting the  office for clarification. Should we see a critical lab result, you will be contacted sooner.   If You Need Anything After Your Visit  If you have any questions or concerns for your doctor, please call our main line at [phone] and press option 4 to reach your doctor's medical assistant. If no one answers, please leave a voicemail as directed and we will return your call as soon as possible. Messages left after 4 pm will be answered the following business day.   You may also send [place] a message via MyChart. We typically respond to MyChart messages within 1-2 business days.  For prescription refills, please ask your pharmacy to contact our office. Our fax number is [phone].  If you have an urgent issue when the clinic is closed that cannot wait until the next business day, you can page your doctor at the number below.    Please note that while we do our best to be available for urgent issues outside of office hours, we are not available 24/7.   If you have an urgent issue and are unable to reach [place], you may choose to seek medical care at your doctor's office, retail clinic, urgent care center, or emergency room.  If you have a medical emergency, please immediately call 911 or go to the emergency department.  Pager Numbers  - Dr. Gwen Pounds: [phone]  - Dr. Neale Burly: [phone]  - Dr. Roseanne Reno: [phone]  In the event of inclement weather, please call our main line at [phone] for an update on the status of any delays or closures.  Dermatology Medication  Tips: Please keep the boxes that topical medications come in in order to help keep track of the instructions about where and how to use these. Pharmacies typically print the medication instructions only on the boxes and not directly on the medication tubes.   If your medication is too expensive, please contact our office at [phone] option 4 or send [place] a message through MyChart.   We are unable to tell what your co-pay  for medications will be in advance as this is different depending on your insurance coverage. However, we may be able to find a substitute medication at lower cost or fill out paperwork to get insurance to cover a needed medication.   If a prior authorization is required to get your medication covered by your insurance company, please allow [place] 1-2 business days to complete this process.  Drug prices often vary depending on where the prescription is filled and some pharmacies may offer cheaper prices.  The website www.goodrx.com contains coupons for medications through different pharmacies. The prices here do not account for what the cost may be with help from insurance (it may be cheaper with your insurance), but the website can give you the price if you did not use any insurance.  - You can print the associated coupon and take it with your prescription to the pharmacy.  - You may also stop by our office during regular business hours and pick up a GoodRx coupon card.  - If you need your prescription sent electronically to a different pharmacy, notify our office through [facility] or by phone at [phone] option 4.     Si Usted Necesita Algo Despus de Su Visita  Tambin puede enviarnos un mensaje a travs de Clinical cytogeneticist. Por lo general respondemos a los mensajes de MyChart en el transcurso de 1 a 2 das hbiles.  Para renovar recetas, por favor pida a su farmacia que se ponga en contacto con nuestra oficina. Annie Sable de fax es [place] [phone].  Si tiene un asunto urgente cuando la clnica est cerrada y que no puede esperar hasta el siguiente da hbil, puede llamar/localizar a su doctor(a) al nmero que aparece a continuacin.   Por favor, tenga en cuenta que aunque hacemos todo lo posible para estar disponibles para asuntos urgentes fuera del horario de [place], no estamos disponibles las 24 horas del da, los 7 [address]  [address] de la [place].   Si tiene un problema urgente y no puede  comunicarse con nosotros, puede optar por buscar atencin mdica  en el consultorio de su doctor(a), en una clnica privada, en un centro de atencin urgente o en una sala de emergencias.  Si tiene Engineer, drilling, por favor llame inmediatamente al 911 o vaya a la sala de emergencias.  Nmeros de bper  - Dr. Gwen Pounds: [phone]  - Dra. Moye: [phone]  - Dra. Roseanne Reno: [phone]  En caso de inclemencias del [place], por favor llame a Lacy Duverney principal al [phone] para una actualizacin sobre el [place] de cualquier retraso o cierre.  Consejos para la medicacin en dermatologa: Por favor, guarde las cajas en las que vienen los medicamentos de uso tpico para ayudarle a seguir las instrucciones sobre dnde y cmo usarlos. Las farmacias generalmente imprimen las instrucciones del medicamento slo en las cajas y no directamente en los tubos del [place].   Si su medicamento es muy caro, por favor, pngase en contacto con Rolm Gala llamando al [phone] y presione la opcin 4 o envenos un mensaje a  travs de MyChart.   No podemos decirle cul ser su copago por los medicamentos por adelantado ya que esto es diferente dependiendo de la cobertura de su seguro. Sin embargo, es posible que podamos encontrar un medicamento sustituto a Audiological scientist un formulario para que el seguro cubra el medicamento que se considera necesario.   Si se requiere una autorizacin previa para que su compaa de seguros [place] su medicamento, por favor permtanos de 1 a 2 das hbiles para completar [address]  Los precios de los medicamentos varan con frecuencia dependiendo del Environmental consultant de dnde se surte la receta y alguna farmacias pueden ofrecer precios ms baratos.  El sitio web www.goodrx.com tiene cupones para medicamentos de Health and safety inspector. Los precios aqu no tienen en cuenta lo que podra costar con la ayuda del seguro (puede ser ms barato con su seguro), pero  el sitio web puede darle el precio si no utiliz Tourist information centre manager.  - Puede imprimir el cupn correspondiente y llevarlo con su receta a la farmacia.  - Tambin puede pasar por nuestra oficina durante el horario de atencin regular y Education officer, museum una tarjeta de cupones de GoodRx.  - Si necesita que su receta se enve electrnicamente a una farmacia diferente, informe a nuestra oficina a travs de MyChart de [place] o por telfono llamando al [phone] y presione la opcin 4.

## 2022-08-20 ENCOUNTER — Ambulatory Visit: Payer: Medicare Other | Admitting: Dermatology

## 2022-08-21 ENCOUNTER — Encounter: Payer: Self-pay | Admitting: Dermatology

## 2022-08-26 ENCOUNTER — Ambulatory Visit: Payer: Medicare Other

## 2022-08-31 ENCOUNTER — Other Ambulatory Visit: Payer: Self-pay | Admitting: Dermatology

## 2022-08-31 DIAGNOSIS — L2081 Atopic neurodermatitis: Secondary | ICD-10-CM

## 2022-09-02 ENCOUNTER — Ambulatory Visit (INDEPENDENT_AMBULATORY_CARE_PROVIDER_SITE_OTHER): Payer: Medicare Other

## 2022-09-02 DIAGNOSIS — L209 Atopic dermatitis, unspecified: Secondary | ICD-10-CM

## 2022-09-02 MED ORDER — DUPILUMAB 300 MG/2ML ~~LOC~~ SOSY
300.0000 mg | PREFILLED_SYRINGE | Freq: Once | SUBCUTANEOUS | Status: AC
Start: 2022-09-02 — End: 2022-09-02
  Administered 2022-09-02: 300 mg via SUBCUTANEOUS

## 2022-09-02 NOTE — Progress Notes (Signed)
Patient here today for two week Dupixent injection for severe Atopic Dermatitis.    Dupixent 300mg  injected into patient left upper arm. Patient tolerated well.     LOT: [id number]  EXP: 09/2024 NDC: 6644-0347-42   Evorn Gong RMA

## 2022-09-10 ENCOUNTER — Telehealth: Payer: Self-pay

## 2022-09-10 DIAGNOSIS — L209 Atopic dermatitis, unspecified: Secondary | ICD-10-CM

## 2022-09-10 NOTE — Telephone Encounter (Signed)
Okay to enter orders for Dupixent every 2 weeks until follow up with you in January or February?  I will make sure patient gets schedule at appointment next week.

## 2022-09-14 MED ORDER — DUPILUMAB 300 MG/2ML ~~LOC~~ SOSY
300.0000 mg | PREFILLED_SYRINGE | SUBCUTANEOUS | Status: AC
Start: 2022-09-14 — End: 2023-03-15
  Administered 2022-09-30 – 2023-02-15 (×9): 300 mg via SUBCUTANEOUS

## 2022-09-16 ENCOUNTER — Ambulatory Visit (INDEPENDENT_AMBULATORY_CARE_PROVIDER_SITE_OTHER): Payer: Medicare Other

## 2022-09-16 DIAGNOSIS — L209 Atopic dermatitis, unspecified: Secondary | ICD-10-CM

## 2022-09-16 MED ORDER — DUPILUMAB 300 MG/2ML ~~LOC~~ SOSY
300.0000 mg | PREFILLED_SYRINGE | Freq: Once | SUBCUTANEOUS | Status: AC
Start: 2022-09-16 — End: 2022-09-16
  Administered 2022-09-16: 300 mg via SUBCUTANEOUS

## 2022-09-16 NOTE — Progress Notes (Signed)
Patient here today for two week Dupixent injection for severe Atopic Dermatitis.    Dupixent 300mg  injected into patient right upper arm. Patient tolerated well.     LOT: [id number]    EXP: 09/2024 NDC: 9562-1308-65   Evorn Gong RMA

## 2022-09-21 ENCOUNTER — Other Ambulatory Visit: Payer: Self-pay | Admitting: Dermatology

## 2022-09-21 DIAGNOSIS — L2081 Atopic neurodermatitis: Secondary | ICD-10-CM

## 2022-09-30 ENCOUNTER — Ambulatory Visit (INDEPENDENT_AMBULATORY_CARE_PROVIDER_SITE_OTHER): Payer: Medicare Other

## 2022-09-30 DIAGNOSIS — L209 Atopic dermatitis, unspecified: Secondary | ICD-10-CM

## 2022-09-30 NOTE — Progress Notes (Signed)
Patient here today for two week Dupixent injection for severe Atopic Dermatitis.    Dupixent 300mg  injected into patient left upper arm. Patient tolerated well.     LOT: [id number]    EXP: 09/2024 NDC: 4696-2952-84   Dorathy Daft RMA

## 2022-10-14 ENCOUNTER — Ambulatory Visit (INDEPENDENT_AMBULATORY_CARE_PROVIDER_SITE_OTHER): Payer: Medicare Other

## 2022-10-14 DIAGNOSIS — L209 Atopic dermatitis, unspecified: Secondary | ICD-10-CM

## 2022-10-14 MED ORDER — DUPILUMAB 300 MG/2ML ~~LOC~~ SOSY
300.0000 mg | PREFILLED_SYRINGE | Freq: Once | SUBCUTANEOUS | Status: AC
Start: 2022-10-14 — End: 2022-10-14
  Administered 2022-10-14: 300 mg via SUBCUTANEOUS

## 2022-10-14 NOTE — Progress Notes (Signed)
Patient here today for two week Dupixent injection for severe Atopic Dermatitis.    Dupixent 300mg  injected into patient right upper arm. Patient tolerated well.     LOT: [id number]    EXP: 07/2024 NDC: 2956-2130-86   Dorathy Daft RMA

## 2022-10-23 ENCOUNTER — Other Ambulatory Visit: Payer: Medicare Other

## 2022-10-23 ENCOUNTER — Other Ambulatory Visit: Payer: Self-pay

## 2022-10-23 DIAGNOSIS — E7800 Pure hypercholesterolemia, unspecified: Secondary | ICD-10-CM

## 2022-10-24 LAB — HEPATIC FUNCTION PANEL
ALT: 8 IU/L (ref 0–44)
AST: 16 IU/L (ref 0–40)
Albumin: 3.9 g/dL (ref 3.7–4.7)
Alkaline Phosphatase: 65 IU/L (ref 44–121)
Bilirubin Total: 0.6 mg/dL (ref 0.0–1.2)
Bilirubin, Direct: 0.16 mg/dL (ref 0.00–0.40)
Total Protein: 6.9 g/dL (ref 6.0–8.5)

## 2022-10-24 LAB — LIPID PANEL
Chol/HDL Ratio: 2.1 ratio (ref 0.0–5.0)
Cholesterol, Total: 176 mg/dL (ref 100–199)
HDL: 82 mg/dL (ref >39)
LDL Chol Calc (NIH): 82 mg/dL (ref 0–99)
Triglycerides: 62 mg/dL (ref 0–149)
VLDL Cholesterol Cal: 12 mg/dL (ref 5–40)

## 2022-10-24 LAB — CK: Total CK: 112 U/L (ref 30–208)

## 2022-10-27 ENCOUNTER — Encounter: Payer: Self-pay | Admitting: Internal Medicine

## 2022-10-27 ENCOUNTER — Ambulatory Visit (INDEPENDENT_AMBULATORY_CARE_PROVIDER_SITE_OTHER): Payer: Medicare Other | Admitting: Internal Medicine

## 2022-10-27 VITALS — BP 140/95 | HR 58 | Ht 69.0 in | Wt 153.6 lb

## 2022-10-27 DIAGNOSIS — Z91148 Patient's other noncompliance with medication regimen for other reason: Secondary | ICD-10-CM

## 2022-10-27 DIAGNOSIS — I10 Essential (primary) hypertension: Secondary | ICD-10-CM | POA: Insufficient documentation

## 2022-10-27 DIAGNOSIS — F17210 Nicotine dependence, cigarettes, uncomplicated: Secondary | ICD-10-CM

## 2022-10-27 DIAGNOSIS — M1A9XX0 Chronic gout, unspecified, without tophus (tophi): Secondary | ICD-10-CM

## 2022-10-27 DIAGNOSIS — J301 Allergic rhinitis due to pollen: Secondary | ICD-10-CM | POA: Diagnosis not present

## 2022-10-27 DIAGNOSIS — E7800 Pure hypercholesterolemia, unspecified: Secondary | ICD-10-CM | POA: Diagnosis not present

## 2022-10-27 MED ORDER — FEXOFENADINE HCL 180 MG PO TABS
180.0000 mg | ORAL_TABLET | Freq: Every day | ORAL | 1 refills | Status: DC
Start: 2022-10-27 — End: 2023-04-26

## 2022-10-27 MED ORDER — FEBUXOSTAT 40 MG PO TABS: 40.0000 mg | ORAL_TABLET | Freq: Every day | ORAL | 0 refills | Status: DC

## 2022-10-27 MED ORDER — AMLODIPINE BESYLATE 10 MG PO TABS: 10.0000 mg | ORAL_TABLET | Freq: Every day | ORAL | 0 refills | Status: DC

## 2022-10-27 MED ORDER — CARVEDILOL 25 MG PO TABS
25.0000 mg | ORAL_TABLET | Freq: Two times a day (BID) | ORAL | 1 refills | Status: DC
Start: 2022-10-27 — End: 2023-08-03

## 2022-10-27 MED ORDER — FLUTICASONE PROPIONATE 50 MCG/ACT NA SUSP
1.0000 | Freq: Every day | NASAL | 2 refills | Status: DC
Start: 2022-10-27 — End: 2023-04-26

## 2022-10-27 NOTE — Progress Notes (Signed)
Established Patient Office Visit  Subjective:  Patient ID: Larry Parsons, male    DOB: [birth year]  Age: 82 y.o. MRN: [id number]  Chief Complaint  Patient presents with   Follow-up    3 mo f/u    No new complaints, here for lab review and medication refills. LDL and TC well controlled on lab review. Triglycerides also satisfactory with normal LFTs and CK. Apparently stopped taking his antihypertensives unilaterally.  No other concerns at this time.   Past Medical History:  Diagnosis Date   Arthralgia    Arthritis    Asthma    childhood   COPD (chronic obstructive pulmonary disease) (HCC)    Elevated liver enzymes    Fatty liver    GERD (gastroesophageal reflux disease)    Hepatic fibrosis    Hepatitis C    Hypertension    Positive colorectal cancer screening using Cologuard test    Stroke [facility])    2006    Past Surgical History:  Procedure Laterality Date   COLONOSCOPY N/A 07/30/2021   Procedure: COLONOSCOPY;  Surgeon: Toledo, Boykin Nearing, MD;  Location: [facility] ENDOSCOPY;  Service: Gastroenterology;  Laterality: N/A;   COLONOSCOPY WITH PROPOFOL N/A 03/17/2017   Procedure: COLONOSCOPY WITH PROPOFOL;  Surgeon: Toledo, Boykin Nearing, MD;  Location: [facility] ENDOSCOPY;  Service: Gastroenterology;  Laterality: N/A;   COLONOSCOPY WITH PROPOFOL N/A 12/01/2018   Procedure: COLONOSCOPY WITH PROPOFOL;  Surgeon: Toledo, Boykin Nearing, MD;  Location: [facility] ENDOSCOPY;  Service: Gastroenterology;  Laterality: N/A;   FLEXIBLE SIGMOIDOSCOPY N/A 04/13/2017   Procedure: FLEXIBLE SIGMOIDOSCOPY;  Surgeon: Toledo, Boykin Nearing, MD;  Location: [facility] ENDOSCOPY;  Service: Gastroenterology;  Laterality: N/A;   FLEXIBLE SIGMOIDOSCOPY N/A 10/13/2017   Procedure: FLEXIBLE SIGMOIDOSCOPY;  Surgeon: Toledo, Boykin Nearing, MD;  Location: [facility] ENDOSCOPY;  Service: Gastroenterology;  Laterality: N/A;    Social History   Socioeconomic History   Marital status: Married    Spouse name: Not on file   Number of children: Not on file    Years of education: Not on file   Highest education level: Not on file  Occupational History   Not on file  Tobacco Use   Smoking status: Never   Smokeless tobacco: Current    Types: Snuff  Vaping Use   Vaping status: Never Used  Substance and Sexual Activity   Alcohol use: Yes    Alcohol/week: 4.0 standard drinks of alcohol    Types: 4 Shots of liquor per week    Comment: 3x/week (1 beer) occassionally    Drug use: No   Sexual activity: Not on file  Other Topics Concern   Not on file  Social History Narrative   Not on file   Social Determinants of Health   Financial Resource Strain: Not on file  Food Insecurity: Unknown (02/25/2019)   [facility] from [facility], [facility]   Hunger Vital Sign    Worried About Running Out of Food in the Last Year: Patient declined    Ran Out of Food in the Last Year: Patient declined  Transportation Needs: Not on file  Physical Activity: Not on file  Stress: Not on file  Social Connections: Not on file  Intimate Partner Violence: Not on file    Family History  Problem Relation Age of Onset   Heart attack Father    Heart failure Father     Allergies  Allergen Reactions   Ace Inhibitors Swelling    Other reaction(Candler Ginsberg): Angioedema, Other (See Comments), Other (see  comments) Other reaction(Nakema Fake): Other (See Comments) Angioedema Other reaction(Sybrina Laning): Other (See Comments) Angioedema Other reaction(Mariaha Ellington): Other (See Comments) Angioedema    Lisinopril Other (See Comments)    Angioedema   Shellfish Allergy Anaphylaxis   Penicillins Rash    Other reaction(Nehemiah Mcfarren): Other (See Comments) "hard spot" if injected Other reaction(Griffith Santilli): Other (See Comments) "hard spot" if injected Other reaction(Niyana Chesbro): Other (See Comments) "hard spot" if injected Other reaction(Miraj Truss): Other (See Comments) "hard spot" if injected     Review of Systems  Constitutional: Negative.   HENT: Negative.    Eyes: Negative.   Respiratory: Negative.    Cardiovascular:  Negative.   Gastrointestinal: Negative.   Genitourinary: Negative.   Skin: Negative.   Neurological: Negative.   Endo/Heme/Allergies: Negative.        Objective:   BP (!) 140/95   Pulse (!) 58   Ht 5\' 9"  (1.753 m)   Wt 153 lb 9.6 oz (69.7 kg)   SpO2 96%   BMI 22.68 kg/m   Vitals:   10/27/22 1000  BP: (!) 140/95  Pulse: (!) 58  Height: 5\' 9"  (1.753 m)  Weight: 153 lb 9.6 oz (69.7 kg)  SpO2: 96%  BMI (Calculated): 22.67    Physical Exam Vitals reviewed.  Constitutional:      Appearance: Normal appearance.  HENT:     Head: Normocephalic.     Left Ear: There is no impacted cerumen.     Nose: Nose normal.     Mouth/Throat:     Mouth: Mucous membranes are moist.     Pharynx: No posterior oropharyngeal erythema.  Eyes:     Extraocular Movements: Extraocular movements intact.     Pupils: Pupils are equal, round, and reactive to light.  Cardiovascular:     Rate and Rhythm: Regular rhythm.     Chest Wall: PMI is not displaced.     Pulses: Normal pulses.     Heart sounds: Normal heart sounds. No murmur heard. Pulmonary:     Effort: Pulmonary effort is normal.     Breath sounds: Normal air entry. No rhonchi or rales.  Abdominal:     General: Abdomen is flat. Bowel sounds are normal. There is no distension.     Palpations: Abdomen is soft. There is no hepatomegaly, splenomegaly or mass.     Tenderness: There is no abdominal tenderness.  Musculoskeletal:        General: Normal range of motion.     Cervical back: Normal range of motion and neck supple.     Right lower leg: No edema.     Left lower leg: No edema.  Skin:    General: Skin is warm and dry.  Neurological:     General: No focal deficit present.     Mental Status: He is alert and oriented to person, place, and time.     Cranial Nerves: No cranial nerve deficit.     Motor: No weakness.  Psychiatric:        Mood and Affect: Mood normal.        Behavior: Behavior normal.   No results found for any  visits on 10/27/22.  Recent Results (from the past 2160 hour(Kamori Kitchens))  Hepatic function panel     Status: None   Collection Time: 10/23/22 10:05 AM  Result Value Ref Range   Total Protein 6.9 6.0 - 8.5 g/dL   Albumin 3.9 3.7 - 4.7 g/dL   Bilirubin Total 0.6 0.0 - 1.2 mg/dL   Bilirubin, Direct 4.85 0.00 -  0.40 mg/dL   Alkaline Phosphatase 65 44 - 121 IU/L   AST 16 0 - 40 IU/L   ALT 8 0 - 44 IU/L  Lipid panel     Status: None   Collection Time: 10/23/22 10:05 AM  Result Value Ref Range   Cholesterol, Total 176 100 - 199 mg/dL   Triglycerides 62 0 - 149 mg/dL   HDL 82 >16 mg/dL   VLDL Cholesterol Cal 12 5 - 40 mg/dL   LDL Chol Calc (NIH) 82 0 - 99 mg/dL   Chol/HDL Ratio 2.1 0.0 - 5.0 ratio    Comment:                                   T. Chol/HDL Ratio                                             Men  Women                               1/2 Avg.Risk  3.4    3.3                                   Avg.Risk  5.0    4.4                                2X Avg.Risk  9.6    7.1                                3X Avg.Risk 23.4   11.0   CK     Status: None   Collection Time: 10/23/22 10:05 AM  Result Value Ref Range   Total CK 112 30 - 208 U/L      Assessment & Plan:  As per problem list . Problem List Items Addressed This Visit       Other   Chronic gout without tophus   Pure hypercholesterolemia - Primary   Other Visit Diagnoses     Primary hypertension           Return in about 2 weeks (around 11/10/2022) for BP followup.   Total time spent: 20 minutes  Luna Fuse, MD  10/27/2022   This document may have been prepared by [facility] Voice Recognition software and as such may include unintentional dictation errors.

## 2022-10-28 ENCOUNTER — Ambulatory Visit (INDEPENDENT_AMBULATORY_CARE_PROVIDER_SITE_OTHER): Payer: Medicare Other

## 2022-10-28 DIAGNOSIS — L209 Atopic dermatitis, unspecified: Secondary | ICD-10-CM | POA: Diagnosis not present

## 2022-10-28 MED ORDER — DUPILUMAB 300 MG/2ML ~~LOC~~ SOSY
300.0000 mg | PREFILLED_SYRINGE | Freq: Once | SUBCUTANEOUS | Status: AC
Start: 2022-10-28 — End: 2022-10-28
  Administered 2022-10-28: 300 mg via SUBCUTANEOUS

## 2022-10-28 NOTE — Progress Notes (Signed)
Patient here today for two week Dupixent injection for severe Atopic Dermatitis.    Dupixent 300mg  injected into patient left upper arm. Patient tolerated well.     LOT: [id number]    EXP: 11/2023 NDC: 6962-9528-41   Evorn Gong  RMA

## 2022-11-03 ENCOUNTER — Other Ambulatory Visit: Payer: Self-pay

## 2022-11-03 DIAGNOSIS — L2081 Atopic neurodermatitis: Secondary | ICD-10-CM

## 2022-11-03 MED ORDER — DUPIXENT 300 MG/2ML ~~LOC~~ SOSY: 300.0000 mg | PREFILLED_SYRINGE | SUBCUTANEOUS | 3 refills | Status: DC

## 2022-11-10 ENCOUNTER — Ambulatory Visit (INDEPENDENT_AMBULATORY_CARE_PROVIDER_SITE_OTHER): Payer: Medicare Other | Admitting: Internal Medicine

## 2022-11-10 ENCOUNTER — Encounter: Payer: Self-pay | Admitting: Internal Medicine

## 2022-11-10 ENCOUNTER — Other Ambulatory Visit (INDEPENDENT_AMBULATORY_CARE_PROVIDER_SITE_OTHER): Payer: Medicare Other

## 2022-11-10 VITALS — BP 122/62 | HR 52 | Ht 69.0 in | Wt 159.8 lb

## 2022-11-10 DIAGNOSIS — E7800 Pure hypercholesterolemia, unspecified: Secondary | ICD-10-CM

## 2022-11-10 DIAGNOSIS — Z23 Encounter for immunization: Secondary | ICD-10-CM

## 2022-11-10 DIAGNOSIS — I10 Essential (primary) hypertension: Secondary | ICD-10-CM | POA: Diagnosis not present

## 2022-11-10 NOTE — Progress Notes (Signed)
Established Patient Office Visit  Subjective:  Patient ID: Larry Parsons, male    DOB: 1940-05-21  Age: 82 y.o. MRN: [id number]  No chief complaint on file.   No new complaints, here for lab review and medication refills. BP has considerably improved.   No other concerns at this time.   Past Medical History:  Diagnosis Date   Arthralgia    Arthritis    Asthma    childhood   COPD (chronic obstructive pulmonary disease) (HCC)    Elevated liver enzymes    Fatty liver    GERD (gastroesophageal reflux disease)    Hepatic fibrosis    Hepatitis C    Hypertension    Positive colorectal cancer screening using Cologuard test    Stroke [facility])    2006    Past Surgical History:  Procedure Laterality Date   COLONOSCOPY N/A 07/30/2021   Procedure: COLONOSCOPY;  Surgeon: Toledo, Boykin Nearing, MD;  Location: [facility] ENDOSCOPY;  Service: Gastroenterology;  Laterality: N/A;   COLONOSCOPY WITH PROPOFOL N/A 03/17/2017   Procedure: COLONOSCOPY WITH PROPOFOL;  Surgeon: Toledo, Boykin Nearing, MD;  Location: [facility] ENDOSCOPY;  Service: Gastroenterology;  Laterality: N/A;   COLONOSCOPY WITH PROPOFOL N/A 12/01/2018   Procedure: COLONOSCOPY WITH PROPOFOL;  Surgeon: Toledo, Boykin Nearing, MD;  Location: [facility] ENDOSCOPY;  Service: Gastroenterology;  Laterality: N/A;   FLEXIBLE SIGMOIDOSCOPY N/A 04/13/2017   Procedure: FLEXIBLE SIGMOIDOSCOPY;  Surgeon: Toledo, Boykin Nearing, MD;  Location: [facility] ENDOSCOPY;  Service: Gastroenterology;  Laterality: N/A;   FLEXIBLE SIGMOIDOSCOPY N/A 10/13/2017   Procedure: FLEXIBLE SIGMOIDOSCOPY;  Surgeon: Toledo, Boykin Nearing, MD;  Location: [facility] ENDOSCOPY;  Service: Gastroenterology;  Laterality: N/A;    Social History   Socioeconomic History   Marital status: Married    Spouse name: Not on file   Number of children: Not on file   Years of education: Not on file   Highest education level: Not on file  Occupational History   Not on file  Tobacco Use   Smoking status: Never   Smokeless  tobacco: Current    Types: Snuff  Vaping Use   Vaping status: Never Used  Substance and Sexual Activity   Alcohol use: Yes    Alcohol/week: 4.0 standard drinks of alcohol    Types: 4 Shots of liquor per week    Comment: 3x/week (1 beer) occassionally    Drug use: No   Sexual activity: Not on file  Other Topics Concern   Not on file  Social History Narrative   Not on file   Social Determinants of Health   Financial Resource Strain: Not on file  Food Insecurity: Unknown (02/25/2019)   Received from [facility], [facility]   Hunger Vital Sign    Worried About Running Out of Food in the Last Year: Patient declined    Ran Out of Food in the Last Year: Patient declined  Transportation Needs: Not on file  Physical Activity: Not on file  Stress: Not on file  Social Connections: Not on file  Intimate Partner Violence: Not on file    Family History  Problem Relation Age of Onset   Heart attack Father    Heart failure Father     Allergies  Allergen Reactions   Ace Inhibitors Swelling    Other reaction(Constantino Starace): Angioedema, Other (See Comments), Other (see comments) Other reaction(Tamico Mundo): Other (See Comments) Angioedema Other reaction(Dalina Samara): Other (See Comments) Angioedema Other reaction(Arriah Wadle): Other (See Comments) Angioedema    Lisinopril Other (See Comments) and Swelling  Angioedema   Shellfish Allergy Anaphylaxis   Penicillins Rash    Other reaction(Ludwig Tugwell): Other (See Comments) "hard spot" if injected Other reaction(Jaysie Benthall): Other (See Comments) "hard spot" if injected Other reaction(Mance Vallejo): Other (See Comments) "hard spot" if injected Other reaction(Tarig Zimmers): Other (See Comments) "hard spot" if injected     Review of Systems  Constitutional: Negative.   HENT: Negative.    Eyes: Negative.   Respiratory: Negative.    Cardiovascular: Negative.   Gastrointestinal: Negative.   Genitourinary: Negative.   Skin: Negative.   Neurological: Negative.   Endo/Heme/Allergies: Negative.         Objective:   BP 122/62   Pulse (!) 52   Ht 5\' 9"  (1.753 m)   Wt 159 lb 12.8 oz (72.5 kg)   SpO2 (!) 88%   BMI 23.60 kg/m   Vitals:   11/10/22 1027  BP: 122/62  Pulse: (!) 52  Height: 5\' 9"  (1.753 m)  Weight: 159 lb 12.8 oz (72.5 kg)  SpO2: (!) 88%  BMI (Calculated): 23.59    Physical Exam Vitals reviewed.  Constitutional:      Appearance: Normal appearance.  HENT:     Head: Normocephalic.     Left Ear: There is no impacted cerumen.     Nose: Nose normal.     Mouth/Throat:     Mouth: Mucous membranes are moist.     Pharynx: No posterior oropharyngeal erythema.  Eyes:     Extraocular Movements: Extraocular movements intact.     Pupils: Pupils are equal, round, and reactive to light.  Cardiovascular:     Rate and Rhythm: Regular rhythm.     Chest Wall: PMI is not displaced.     Pulses: Normal pulses.     Heart sounds: Normal heart sounds. No murmur heard. Pulmonary:     Effort: Pulmonary effort is normal.     Breath sounds: Normal air entry. No rhonchi or rales.  Abdominal:     General: Abdomen is flat. Bowel sounds are normal. There is no distension.     Palpations: Abdomen is soft. There is no hepatomegaly, splenomegaly or mass.     Tenderness: There is no abdominal tenderness.  Musculoskeletal:        General: Normal range of motion.     Cervical back: Normal range of motion and neck supple.     Right lower leg: No edema.     Left lower leg: No edema.  Skin:    General: Skin is warm and dry.  Neurological:     General: No focal deficit present.     Mental Status: He is alert and oriented to person, place, and time.     Cranial Nerves: No cranial nerve deficit.     Motor: No weakness.  Psychiatric:        Mood and Affect: Mood normal.        Behavior: Behavior normal.      No results found for any visits on 11/10/22.  Recent Results (from the past 2160 hour(Brooklen Runquist))  Hepatic function panel     Status: None   Collection Time: 10/23/22 10:05 AM   Result Value Ref Range   Total Protein 6.9 6.0 - 8.5 g/dL   Albumin 3.9 3.7 - 4.7 g/dL   Bilirubin Total 0.6 0.0 - 1.2 mg/dL   Bilirubin, Direct 1.61 0.00 - 0.40 mg/dL   Alkaline Phosphatase 65 44 - 121 IU/L   AST 16 0 - 40 IU/L   ALT 8 0 - 44  IU/L  Lipid panel     Status: None   Collection Time: 10/23/22 10:05 AM  Result Value Ref Range   Cholesterol, Total 176 100 - 199 mg/dL   Triglycerides 62 0 - 149 mg/dL   HDL 82 >60 mg/dL   VLDL Cholesterol Cal 12 5 - 40 mg/dL   LDL Chol Calc (NIH) 82 0 - 99 mg/dL   Chol/HDL Ratio 2.1 0.0 - 5.0 ratio    Comment:                                   T. Chol/HDL Ratio                                             Men  Women                               1/2 Avg.Risk  3.4    3.3                                   Avg.Risk  5.0    4.4                                2X Avg.Risk  9.6    7.1                                3X Avg.Risk 23.4   11.0   CK     Status: None   Collection Time: 10/23/22 10:05 AM  Result Value Ref Range   Total CK 112 30 - 208 U/L      Assessment & Plan:  As per problem list. BP well controlled. Problem List Items Addressed This Visit       Cardiovascular and Mediastinum   Primary hypertension - Primary     Other   Pure hypercholesterolemia   Relevant Orders   Comprehensive metabolic panel   Lipid panel   CK    Return in about 10 weeks (around 01/19/2023) for fu with labs prior.   Total time spent: 20 minutes  Luna Fuse, MD  11/10/2022   This document may have been prepared by [facility] Voice Recognition software and as such may include unintentional dictation errors.

## 2022-11-11 ENCOUNTER — Ambulatory Visit: Payer: Medicare Other

## 2022-11-11 DIAGNOSIS — L209 Atopic dermatitis, unspecified: Secondary | ICD-10-CM | POA: Diagnosis not present

## 2022-11-11 NOTE — Progress Notes (Signed)
error 

## 2022-11-11 NOTE — Patient Instructions (Signed)

## 2022-11-11 NOTE — Progress Notes (Signed)
Patient here today for two week Dupixent injection for severe Atopic Dermatitis.    Dupixent 300mg  injected into patient right upper arm. Patient tolerated well.     LOT: [id number]  EXP: 07/2024 NDC: 6962-9528-41   Dorathy Daft, RMA

## 2022-11-25 ENCOUNTER — Ambulatory Visit (INDEPENDENT_AMBULATORY_CARE_PROVIDER_SITE_OTHER): Payer: Medicare Other

## 2022-11-25 DIAGNOSIS — L209 Atopic dermatitis, unspecified: Secondary | ICD-10-CM | POA: Diagnosis not present

## 2022-11-25 NOTE — Progress Notes (Signed)
Patient here today for two week Dupixent injection for severe Atopic Dermatitis.    Dupixent 300mg  injected into patient right upper arm. Patient tolerated well.     LOT: [id number]  EXP: 09/2024 NDC: 9147-8295-62   Dorathy Daft, RMA

## 2022-12-09 ENCOUNTER — Ambulatory Visit (INDEPENDENT_AMBULATORY_CARE_PROVIDER_SITE_OTHER): Payer: Medicare Other

## 2022-12-09 DIAGNOSIS — L209 Atopic dermatitis, unspecified: Secondary | ICD-10-CM

## 2022-12-09 NOTE — Progress Notes (Signed)
Patient here today for two week Dupixent injection for severe Atopic Dermatitis.    Dupixent 300mg  injected into patient left upper arm. Patient tolerated well.     LOT: [id number] EXP: 12/2024 NDC: 9147-8295-62  Patient advised we do not have any prescription Dupixent for him in the fridge and sample used today. Patient advised to call pharmacy and check on shipments. aw   Dorathy Daft, RMA

## 2022-12-16 ENCOUNTER — Other Ambulatory Visit: Payer: Self-pay

## 2022-12-16 DIAGNOSIS — J309 Allergic rhinitis, unspecified: Secondary | ICD-10-CM

## 2022-12-16 MED ORDER — MONTELUKAST SODIUM 10 MG PO TABS
10.0000 mg | ORAL_TABLET | Freq: Every day | ORAL | 1 refills | Status: DC
Start: 2022-12-16 — End: 2023-08-03

## 2022-12-23 ENCOUNTER — Ambulatory Visit: Payer: Medicare Other

## 2022-12-23 DIAGNOSIS — L209 Atopic dermatitis, unspecified: Secondary | ICD-10-CM

## 2022-12-23 NOTE — Progress Notes (Signed)
Patient here today for two week Dupixent injection for severe Atopic Dermatitis.    Dupixent 300mg  injected into patient right upper arm. Patient tolerated well.     LOT: [id number] EXP: 09/2024 NDC: 1191-4782-95   Patient advised we do not have any prescription Dupixent for him in the fridge and sample used today. Patient states he does not have internet and his daughter is trying to help order by phone.   Dorathy Daft, RMA

## 2023-01-06 ENCOUNTER — Ambulatory Visit: Payer: Medicare Other

## 2023-01-06 DIAGNOSIS — L209 Atopic dermatitis, unspecified: Secondary | ICD-10-CM | POA: Diagnosis not present

## 2023-01-06 NOTE — Progress Notes (Signed)
Patient here today for two week Dupixent injection for severe Atopic Dermatitis.    Dupixent 300mg  injected into patient left upper arm. Patient tolerated well.     LOT: [id number] EXP: 09/09/2024 NDC: 0454-0981-19   Patient advised we do not have any prescription Dupixent for him in the fridge and sample used today. Patient states he does not have internet and his daughter is trying to help order by phone.    Dorathy Daft, RMA

## 2023-01-14 ENCOUNTER — Other Ambulatory Visit: Payer: Medicare Other

## 2023-01-19 ENCOUNTER — Encounter: Payer: Self-pay | Admitting: Internal Medicine

## 2023-01-19 ENCOUNTER — Ambulatory Visit (INDEPENDENT_AMBULATORY_CARE_PROVIDER_SITE_OTHER): Payer: Medicare Other | Admitting: Internal Medicine

## 2023-01-19 VITALS — BP 112/71 | HR 50 | Ht 69.0 in | Wt 159.6 lb

## 2023-01-19 DIAGNOSIS — L2081 Atopic neurodermatitis: Secondary | ICD-10-CM | POA: Insufficient documentation

## 2023-01-19 DIAGNOSIS — M1A9XX0 Chronic gout, unspecified, without tophus (tophi): Secondary | ICD-10-CM | POA: Diagnosis not present

## 2023-01-19 DIAGNOSIS — E7800 Pure hypercholesterolemia, unspecified: Secondary | ICD-10-CM | POA: Diagnosis not present

## 2023-01-19 DIAGNOSIS — I10 Essential (primary) hypertension: Secondary | ICD-10-CM

## 2023-01-19 NOTE — Progress Notes (Signed)
Established Patient Office Visit  Subjective:  Patient ID: Larry Parsons, male    DOB: 03-30-1940  Age: 82 y.o. MRN: [id number]  Chief Complaint  Patient presents with   Follow-up    No new complaints, here for lab review and medication refills. Failed to have previsit labs done due to insurance. Concerned about cost of rx for his atopical dermatitis.   No other concerns at this time.   Past Medical History:  Diagnosis Date   Arthralgia    Arthritis    Asthma    childhood   COPD (chronic obstructive pulmonary disease) (HCC)    Elevated liver enzymes    Fatty liver    GERD (gastroesophageal reflux disease)    Hepatic fibrosis    Hepatitis C    Hypertension    Positive colorectal cancer screening using Cologuard test    Stroke [facility])    2006    Past Surgical History:  Procedure Laterality Date   COLONOSCOPY N/A 07/30/2021   Procedure: COLONOSCOPY;  Surgeon: Toledo, Boykin Nearing, MD;  Location: [facility] ENDOSCOPY;  Service: Gastroenterology;  Laterality: N/A;   COLONOSCOPY WITH PROPOFOL N/A 03/17/2017   Procedure: COLONOSCOPY WITH PROPOFOL;  Surgeon: Toledo, Boykin Nearing, MD;  Location: [facility];  Service: Gastroenterology;  Laterality: N/A;   COLONOSCOPY WITH PROPOFOL N/A 12/01/2018   Procedure: COLONOSCOPY WITH PROPOFOL;  Surgeon: Toledo, Boykin Nearing, MD;  Location: [facility] ENDOSCOPY;  Service: Gastroenterology;  Laterality: N/A;   FLEXIBLE SIGMOIDOSCOPY N/A 04/13/2017   Procedure: FLEXIBLE SIGMOIDOSCOPY;  Surgeon: Toledo, Boykin Nearing, MD;  Location: [facility];  Service: Gastroenterology;  Laterality: N/A;   FLEXIBLE SIGMOIDOSCOPY N/A 10/13/2017   Procedure: FLEXIBLE SIGMOIDOSCOPY;  Surgeon: Toledo, Boykin Nearing, MD;  Location: [facility] ENDOSCOPY;  Service: Gastroenterology;  Laterality: N/A;    Social History   Socioeconomic History   Marital status: Married    Spouse name: Not on file   Number of children: Not on file   Years of education: Not on file   Highest education level:  Not on file  Occupational History   Not on file  Tobacco Use   Smoking status: Never   Smokeless tobacco: Current    Types: Snuff  Vaping Use   Vaping status: Never Used  Substance and Sexual Activity   Alcohol use: Yes    Alcohol/week: 4.0 standard drinks of alcohol    Types: 4 Shots of liquor per week    Comment: 3x/week (1 beer) occassionally    Drug use: No   Sexual activity: Not on file  Other Topics Concern   Not on file  Social History Narrative   Not on file   Social Determinants of Health   Financial Resource Strain: Not on file  Food Insecurity: Unknown (02/25/2019)   Received from [facility], [facility]   Hunger Vital Sign    Worried About Running Out of Food in the Last Year: Patient declined    Ran Out of Food in the Last Year: Patient declined  Transportation Needs: Not on file  Physical Activity: Not on file  Stress: Not on file  Social Connections: Not on file  Intimate Partner Violence: Not on file    Family History  Problem Relation Age of Onset   Heart attack Father    Heart failure Father     Allergies  Allergen Reactions   Ace Inhibitors Swelling    Other reaction(Younis Mathey): Angioedema, Other (See Comments), Other (see comments) Other reaction(Hajira Verhagen): Other (See Comments) Angioedema Other reaction(Rayn Enderson):  Other (See Comments) Angioedema Other reaction(Luetta Piazza): Other (See Comments) Angioedema    Lisinopril Other (See Comments) and Swelling    Angioedema   Shellfish Allergy Anaphylaxis   Penicillins Rash    Other reaction(Oswin Griffith): Other (See Comments) "hard spot" if injected Other reaction(Media Pizzini): Other (See Comments) "hard spot" if injected Other reaction(Correll Denbow): Other (See Comments) "hard spot" if injected Other reaction(Drevin Ortner): Other (See Comments) "hard spot" if injected     Outpatient Medications Prior to Visit  Medication Sig   amLODipine (NORVASC) 10 MG tablet Take 1 tablet (10 mg total) by mouth daily.   Apoaequorin (PREVAGEN PO) Take 1 capsule by  mouth daily.   budesonide-formoterol (SYMBICORT) 80-4.5 MCG/ACT inhaler Inhale 2 puffs into the lungs 2 (two) times daily.   carvedilol (COREG) 25 MG tablet Take 1 tablet (25 mg total) by mouth 2 (two) times daily.   clobetasol cream (TEMOVATE) 0.05 % APPLY 1 APPLICATION ONCE DAILY FOR UP TO 5 DAYS A WEEK TO ECZEMA ON BODY. AVOID FACE, GROIN, AXILLA AS DIRECTED   colchicine 0.6 MG tablet Take 0.6 mg by mouth daily. Take 2 tablets at first sign of gout flare followed by 1 tablet after 1 hour. Max 1.8mg  within 1 hour   Crisaborole (EUCRISA) 2 % OINT Apply 1 Application topically daily. For rash at body (atopic dermatitis) (Patient not taking: Reported on 07/22/2022)   dupilumab (DUPIXENT) 300 MG/2ML prefilled syringe Inject 300 mg into the skin every 14 (fourteen) days.   EPINEPHrine 0.3 mg/0.3 mL IJ SOAJ injection Inject 0.3 mg into the muscle as needed for anaphylaxis. (Patient not taking: Reported on 07/22/2022)   febuxostat (ULORIC) 40 MG tablet Take 1 tablet (40 mg total) by mouth daily.   fexofenadine (ALLEGRA) 180 MG tablet Take 1 tablet (180 mg total) by mouth daily.   fluticasone (FLONASE) 50 MCG/ACT nasal spray Place 1 spray into both nostrils daily.   montelukast (SINGULAIR) 10 MG tablet Take 1 tablet (10 mg total) by mouth daily.   omega-3 acid ethyl esters (LOVAZA) 1 g capsule Take 1 g by mouth daily. (Patient not taking: Reported on 04/21/2022)   omeprazole (PRILOSEC) 20 MG capsule Take 20 mg by mouth daily.   tamsulosin (FLOMAX) 0.4 MG CAPS capsule TAKE 1 CAPSULE BY MOUTH EVERY DAY   tiotropium (SPIRIVA) 18 MCG inhalation capsule Place 18 mcg into inhaler and inhale daily. (Patient not taking: Reported on 04/21/2022)   Facility-Administered Medications Prior to Visit  Medication Dose Route Frequency Provider   dupilumab (DUPIXENT) prefilled syringe 300 mg  300 mg Subcutaneous Q14 Days Deirdre Evener, MD    Review of Systems  Constitutional: Negative.   HENT: Negative.    Eyes:  Negative.   Respiratory: Negative.    Cardiovascular: Negative.   Gastrointestinal: Negative.   Genitourinary: Negative.   Skin: Negative.   Neurological: Negative.   Endo/Heme/Allergies: Negative.        Objective:   BP 112/71   Pulse (!) 50   Wt 159 lb 9.6 oz (72.4 kg)   SpO2 94%   BMI 23.57 kg/m   Vitals:   01/19/23 1017  BP: 112/71  Pulse: (!) 50  Weight: 159 lb 9.6 oz (72.4 kg)  SpO2: 94%    Physical Exam Vitals reviewed.  Constitutional:      Appearance: Normal appearance.  HENT:     Head: Normocephalic.     Left Ear: There is no impacted cerumen.     Nose: Nose normal.     Mouth/Throat:  Mouth: Mucous membranes are moist.     Pharynx: No posterior oropharyngeal erythema.  Eyes:     Extraocular Movements: Extraocular movements intact.     Pupils: Pupils are equal, round, and reactive to light.  Cardiovascular:     Rate and Rhythm: Regular rhythm.     Chest Wall: PMI is not displaced.     Pulses: Normal pulses.     Heart sounds: Normal heart sounds. No murmur heard. Pulmonary:     Effort: Pulmonary effort is normal.     Breath sounds: Normal air entry. No rhonchi or rales.  Abdominal:     General: Abdomen is flat. Bowel sounds are normal. There is no distension.     Palpations: Abdomen is soft. There is no hepatomegaly, splenomegaly or mass.     Tenderness: There is no abdominal tenderness.  Musculoskeletal:        General: Normal range of motion.     Cervical back: Normal range of motion and neck supple.     Right lower leg: No edema.     Left lower leg: No edema.  Skin:    General: Skin is warm and dry.  Neurological:     General: No focal deficit present.     Mental Status: He is alert and oriented to person, place, and time.     Cranial Nerves: No cranial nerve deficit.     Motor: No weakness.  Psychiatric:        Mood and Affect: Mood normal.        Behavior: Behavior normal.      No results found for any visits on  01/19/23.      Assessment & Plan:  As per problem list.  Problem List Items Addressed This Visit       Cardiovascular and Mediastinum   Primary hypertension - Primary     Musculoskeletal and Integument   Atopic neurodermatitis     Other   Chronic gout without tophus   Pure hypercholesterolemia   Other Visit Diagnoses     Essential (primary) hypertension           Return in about 3 months (around 04/19/2023).   Total time spent: 20 minutes  Luna Fuse, MD  01/19/2023   This document may have been prepared by [facility] Voice Recognition software and as such may include unintentional dictation errors.

## 2023-01-20 ENCOUNTER — Ambulatory Visit: Payer: Medicare Other

## 2023-01-20 DIAGNOSIS — L209 Atopic dermatitis, unspecified: Secondary | ICD-10-CM

## 2023-01-20 NOTE — Progress Notes (Signed)
Patient here today for two week Dupixent injection for severe Atopic Dermatitis.    Dupixent 300mg  injected into patient right upper arm. Patient tolerated well.     LOT: [id number] EXP: 09/09/2024 NDC: 9811-9147-82   Dorathy Daft, RMA

## 2023-01-26 ENCOUNTER — Other Ambulatory Visit: Payer: Self-pay | Admitting: Internal Medicine

## 2023-01-26 DIAGNOSIS — I10 Essential (primary) hypertension: Secondary | ICD-10-CM

## 2023-01-26 DIAGNOSIS — M1A9XX0 Chronic gout, unspecified, without tophus (tophi): Secondary | ICD-10-CM

## 2023-02-01 ENCOUNTER — Ambulatory Visit: Payer: Medicare Other

## 2023-02-01 DIAGNOSIS — L209 Atopic dermatitis, unspecified: Secondary | ICD-10-CM | POA: Diagnosis not present

## 2023-02-01 NOTE — Progress Notes (Signed)
Patient here today for two week Dupixent injection for severe Atopic Dermatitis.    Dupixent 300mg  injected into patient left upper arm. Patient tolerated well.     LOT: [id number] EXP: 09/09/2024 NDC: 9147-8295-62   Dorathy Daft, RMA

## 2023-02-15 ENCOUNTER — Ambulatory Visit: Payer: Medicare Other

## 2023-02-15 DIAGNOSIS — L209 Atopic dermatitis, unspecified: Secondary | ICD-10-CM

## 2023-02-15 NOTE — Progress Notes (Signed)
 Patient here today for two week Dupixent injection for severe Atopic Dermatitis.    Dupixent 300mg  injected into patient right upper arm. Patient tolerated well.     LOT: [id number] EXP: 09/09/2024 NDC: 9811-9147-82   Dorathy Daft, RMA

## 2023-02-25 ENCOUNTER — Ambulatory Visit: Payer: Medicare Other | Admitting: Dermatology

## 2023-03-02 ENCOUNTER — Telehealth: Payer: Self-pay

## 2023-03-02 NOTE — Telephone Encounter (Signed)
Patient's grand daughter called regarding current medication. Advised Destiny that we have not been getting medication for patient in a while and have been using samples. Destiny was argumentative that she had been trying to call our office and she has a copay savings card for patient. Explained multiple times the copay savings card is not eligible for patient because he has medicare (a government based insurance). Instructed to call Dupixent [employer] to see if patient qualifies for PAP or he would have to use his insurance and run RX through the new M3P plan through Medicare for the 2025 year. aw

## 2023-03-11 ENCOUNTER — Ambulatory Visit: Payer: Medicare Other | Admitting: Dermatology

## 2023-03-11 ENCOUNTER — Encounter: Payer: Self-pay | Admitting: Dermatology

## 2023-03-11 DIAGNOSIS — L209 Atopic dermatitis, unspecified: Secondary | ICD-10-CM | POA: Diagnosis not present

## 2023-03-11 DIAGNOSIS — L2081 Atopic neurodermatitis: Secondary | ICD-10-CM | POA: Diagnosis not present

## 2023-03-11 DIAGNOSIS — Z79899 Other long term (current) drug therapy: Secondary | ICD-10-CM

## 2023-03-11 MED ORDER — DUPILUMAB 300 MG/2ML ~~LOC~~ SOSY
300.0000 mg | PREFILLED_SYRINGE | SUBCUTANEOUS | Status: AC
Start: 2023-03-11 — End: 2023-03-25
  Administered 2023-03-25: 300 mg via SUBCUTANEOUS

## 2023-03-11 MED ORDER — CLOBETASOL PROPIONATE 0.05 % EX CREA: TOPICAL_CREAM | CUTANEOUS | 2 refills | Status: AC

## 2023-03-11 MED ORDER — DUPILUMAB 300 MG/2ML ~~LOC~~ SOSY
300.0000 mg | PREFILLED_SYRINGE | Freq: Once | SUBCUTANEOUS | Status: AC
Start: 2023-03-11 — End: 2023-03-11
  Administered 2023-03-11: 300 mg via SUBCUTANEOUS

## 2023-03-11 NOTE — Patient Instructions (Signed)

## 2023-03-11 NOTE — Progress Notes (Signed)
   Follow-Up Visit   Subjective  Larry Parsons is a 83 y.o. male who presents for the following: Atopic Dermatitis  Patient also using clobetasol cr prn flares, Eucrisa prn. Patient has been getting samples of Dupixent and coming to our office for injections while waiting for pt assistance approval through [employer]. No areas flared per patient today, much improved. Not itching. No side effects, no eye irritation. Patient has been on Dupixent injections for 2 years.  The following portions of the chart were reviewed this encounter and updated as appropriate: medications, allergies, medical history  Review of Systems:  No other skin or systemic complaints except as noted in HPI or Assessment and Plan.  Objective  Well appearing patient in no apparent distress; mood and affect are within normal limits.  Areas Examined: Back, arms, neck  Relevant physical exam findings are noted in the Assessment and Plan.    Assessment & Plan   ATOPIC NEURODERMATITIS   Related Medications dupilumab (DUPIXENT) 300 MG/2ML prefilled syringe Inject 300 mg into the skin every 14 (fourteen) days. clobetasol cream (TEMOVATE) 0.05 % APPLY 1 APPLICATION ONCE DAILY FOR UP TO 5 DAYS A WEEK TO ECZEMA ON BODY. AVOID FACE, GROIN, AXILLA AS DIRECTED dupilumab (DUPIXENT) prefilled syringe 300 mg  dupilumab (DUPIXENT) prefilled syringe 300 mg    ATOPIC DERMATITIS Exam: 3% BSA Hyperpigmented patch at BL arms, mild lichenification at elbows, hyperpigmented patches at posterior neck and multiple at back, with xerosis  Chronic condition with duration or expected duration over one year. Currently well-controlled on Dupixent injections.  Atopic dermatitis - Severe, on Dupixent (biologic medication).  Atopic dermatitis (eczema) is a chronic, relapsing, pruritic condition that can significantly affect quality of life. It is often associated with allergic rhinitis and/or asthma and can require treatment with  topical medications, phototherapy, or in severe cases biologic medications, which require long term medication management.    Treatment Plan:  Continue Dupixent 300 mg/25mL SQ QOW. Patient denies side effects.  Dupixent 300mg /37mL injected SQ into the left upper arm. Patient tolerated injection well.  NDC 1610-9604-54 Lot # [id number]  Exp: 09/2024   Potential side effects include allergic reaction, herpes infections, injection site reactions and conjunctivitis (inflammation of the eyes).  The use of Dupixent requires long term medication management, including periodic office visits.    Long term medication management.  Patient is using long term (months to years) prescription medication  to control their dermatologic condition.  These medications require periodic monitoring to evaluate for efficacy and side effects and may require periodic laboratory monitoring.   Recommend gentle skin care.   Return in about 6 months (around 09/08/2023) for with Dr. Kirtland Bouchard, Dupixent, Dermatitis.  Anise Salvo, RMA, am acting as scribe for Willeen Niece, MD .   Documentation: I have reviewed the above documentation for accuracy and completeness, and I agree with the above.  Willeen Niece, MD

## 2023-03-25 ENCOUNTER — Ambulatory Visit (INDEPENDENT_AMBULATORY_CARE_PROVIDER_SITE_OTHER): Payer: Medicare Other

## 2023-03-25 DIAGNOSIS — L209 Atopic dermatitis, unspecified: Secondary | ICD-10-CM

## 2023-03-25 NOTE — Progress Notes (Signed)
Patient here today for two week Dupixent injection for severe Atopic Dermatitis.    Dupixent 300mg  injected into patient right upper arm. Patient tolerated well.     LOT: [id number] EXP: 03/12/2025 NDC: 7829-5621-30   Dorathy Daft, RMA

## 2023-04-08 ENCOUNTER — Telehealth: Payer: Self-pay

## 2023-04-08 ENCOUNTER — Ambulatory Visit (INDEPENDENT_AMBULATORY_CARE_PROVIDER_SITE_OTHER): Payer: Medicare Other

## 2023-04-08 DIAGNOSIS — L2081 Atopic neurodermatitis: Secondary | ICD-10-CM

## 2023-04-08 DIAGNOSIS — L209 Atopic dermatitis, unspecified: Secondary | ICD-10-CM | POA: Diagnosis not present

## 2023-04-08 NOTE — Telephone Encounter (Signed)
 Patient seen Dr. Roseanne Reno in January for 6 month atopic derm follow up.  OK to enter orders to continue Dupixent?

## 2023-04-08 NOTE — Progress Notes (Signed)
 Patient here today for two week Dupixent injection for severe Atopic Dermatitis.    Dupixent 300mg  injected into patient left upper arm. Patient tolerated well.     LOT: [id number] EXP: 09/09/2024 NDC: 0454-0981-19   Dorathy Daft, RMA

## 2023-04-12 MED ORDER — DUPILUMAB 300 MG/2ML ~~LOC~~ SOSY
300.0000 mg | PREFILLED_SYRINGE | SUBCUTANEOUS | Status: AC
Start: 2023-04-12 — End: 2023-09-27
  Administered 2023-04-08 – 2023-08-11 (×9): 300 mg via SUBCUTANEOUS

## 2023-04-22 ENCOUNTER — Ambulatory Visit: Payer: Medicare Other

## 2023-04-22 ENCOUNTER — Other Ambulatory Visit

## 2023-04-22 DIAGNOSIS — L209 Atopic dermatitis, unspecified: Secondary | ICD-10-CM | POA: Diagnosis not present

## 2023-04-22 DIAGNOSIS — E7800 Pure hypercholesterolemia, unspecified: Secondary | ICD-10-CM

## 2023-04-22 NOTE — Progress Notes (Signed)
 Patient here today for two week Dupixent injection for severe Atopic Dermatitis.    Dupixent 300mg  injected into patient right upper arm. Patient tolerated well.     LOT: [id number] EXP: 09/09/2024 NDC: 0454-0981-19   Dorathy Daft, RMA

## 2023-04-23 LAB — COMPREHENSIVE METABOLIC PANEL
ALT: 6 IU/L (ref 0–44)
AST: 18 IU/L (ref 0–40)
Albumin: 4 g/dL (ref 3.7–4.7)
Alkaline Phosphatase: 67 IU/L (ref 44–121)
BUN/Creatinine Ratio: 10 (ref 10–24)
BUN: 12 mg/dL (ref 8–27)
Bilirubin Total: 0.6 mg/dL (ref 0.0–1.2)
CO2: 25 mmol/L (ref 20–29)
Calcium: 9.3 mg/dL (ref 8.6–10.2)
Chloride: 97 mmol/L (ref 96–106)
Creatinine, Ser: 1.26 mg/dL (ref 0.76–1.27)
Globulin, Total: 2.8 g/dL (ref 1.5–4.5)
Glucose: 89 mg/dL (ref 70–99)
Potassium: 3.7 mmol/L (ref 3.5–5.2)
Sodium: 139 mmol/L (ref 134–144)
Total Protein: 6.8 g/dL (ref 6.0–8.5)
eGFR: 57 mL/min/{1.73_m2} — ABNORMAL LOW (ref >59)

## 2023-04-23 LAB — LIPID PANEL
Chol/HDL Ratio: 2.2 ratio (ref 0.0–5.0)
Cholesterol, Total: 160 mg/dL (ref 100–199)
HDL: 73 mg/dL (ref >39)
LDL Chol Calc (NIH): 70 mg/dL (ref 0–99)
Triglycerides: 94 mg/dL (ref 0–149)
VLDL Cholesterol Cal: 17 mg/dL (ref 5–40)

## 2023-04-23 LAB — CK: Total CK: 113 U/L (ref 30–208)

## 2023-04-26 ENCOUNTER — Encounter: Payer: Self-pay | Admitting: Internal Medicine

## 2023-04-26 ENCOUNTER — Other Ambulatory Visit: Payer: Self-pay | Admitting: Internal Medicine

## 2023-04-26 ENCOUNTER — Ambulatory Visit (INDEPENDENT_AMBULATORY_CARE_PROVIDER_SITE_OTHER): Payer: Medicare Other | Admitting: Internal Medicine

## 2023-04-26 VITALS — BP 112/64 | HR 53 | Temp 98.1°F | Ht 69.0 in | Wt 148.0 lb

## 2023-04-26 DIAGNOSIS — J301 Allergic rhinitis due to pollen: Secondary | ICD-10-CM

## 2023-04-26 DIAGNOSIS — M1A9XX0 Chronic gout, unspecified, without tophus (tophi): Secondary | ICD-10-CM

## 2023-04-26 DIAGNOSIS — J8410 Pulmonary fibrosis, unspecified: Secondary | ICD-10-CM | POA: Insufficient documentation

## 2023-04-26 DIAGNOSIS — I10 Essential (primary) hypertension: Secondary | ICD-10-CM | POA: Diagnosis not present

## 2023-04-26 MED ORDER — FLUTICASONE PROPIONATE 50 MCG/ACT NA SUSP: 1.0000 | Freq: Every day | NASAL | 2 refills | Status: DC

## 2023-04-26 MED ORDER — FEXOFENADINE HCL 180 MG PO TABS: 180.0000 mg | ORAL_TABLET | Freq: Every day | ORAL | 1 refills | Status: DC

## 2023-04-26 MED ORDER — FEBUXOSTAT 40 MG PO TABS: 40.0000 mg | ORAL_TABLET | Freq: Every day | ORAL | 0 refills | Status: DC

## 2023-04-26 MED ORDER — AMLODIPINE BESYLATE 10 MG PO TABS: 10.0000 mg | ORAL_TABLET | Freq: Every day | ORAL | 0 refills | Status: DC

## 2023-04-26 NOTE — Progress Notes (Signed)
 Established Patient Office Visit  Subjective:  Patient ID: Larry Parsons, male    DOB: 1940/05/02  Age: 83 y.o. MRN: [id number]  Chief Complaint  Patient presents with   Follow-up    3 month follow up     C/o recent bout of gastroenteritis a week ago with diarrhea, nausea and vomiting that has now resolved. Labs reviewed and notable for rise in Cr and drop in GFR with well controlled lipids and normal CK.    No other concerns at this time.   Past Medical History:  Diagnosis Date   Arthralgia    Arthritis    Asthma    childhood   COPD (chronic obstructive pulmonary disease) (HCC)    Elevated liver enzymes    Fatty liver    GERD (gastroesophageal reflux disease)    Hepatic fibrosis    Hepatitis C    Hypertension    Positive colorectal cancer screening using Cologuard test    Stroke [facility])    2006    Past Surgical History:  Procedure Laterality Date   COLONOSCOPY N/A 07/30/2021   Procedure: COLONOSCOPY;  Surgeon: Toledo, Boykin Nearing, MD;  Location: [facility] ENDOSCOPY;  Service: Gastroenterology;  Laterality: N/A;   COLONOSCOPY WITH PROPOFOL N/A 03/17/2017   Procedure: COLONOSCOPY WITH PROPOFOL;  Surgeon: Toledo, Boykin Nearing, MD;  Location: [facility];  Service: Gastroenterology;  Laterality: N/A;   COLONOSCOPY WITH PROPOFOL N/A 12/01/2018   Procedure: COLONOSCOPY WITH PROPOFOL;  Surgeon: Toledo, Boykin Nearing, MD;  Location: [facility]OSCOPY;  Service: Gastroenterology;  Laterality: N/A;   FLEXIBLE SIGMOIDOSCOPY N/A 04/13/2017   Procedure: FLEXIBLE SIGMOIDOSCOPY;  Surgeon: Toledo, Boykin Nearing, MD;  Location: [facility]OS[facility];  Service: Gastroenterology;  Laterality: N/A;   FLEXIBLE SIGMOIDOSCOPY N/A 10/13/2017   Procedure: FLEXIBLE SIGMOIDOSCOPY;  Surgeon: Toledo, Boykin Nearing, MD;  Location: [facility]OS[facility];  Service: Gastroenterology;  Laterality: N/A;    Social History   Socioeconomic History   Marital status: Married    Spouse name: Not on file   Number of children: Not on file   Years  of education: Not on file   Highest education level: Not on file  Occupational History   Not on file  Tobacco Use   Smoking status: Never   Smokeless tobacco: Current    Types: Snuff  Vaping Use   Vaping status: Never Used  Substance and Sexual Activity   Alcohol use: Yes    Alcohol/week: 4.0 standard drinks of alcohol    Types: 4 Shots of liquor per week    Comment: 3x/week (1 beer) occassionally    Drug use: No   Sexual activity: Not on file  Other Topics Concern   Not on file  Social History Narrative   Not on file   Social Drivers of Health   Financial Resource Strain: Not on file  Food Insecurity: Unknown (02/25/2019)   Received from [facility]   Hunger Vital Sign    Worried About Running Out of Food in the Last Year: Patient declined    Ran Out of Food in the Last Year: Patient declined  Transportation Needs: Not on file  Physical Activity: Not on file  Stress: Not on file  Social Connections: Not on file  Intimate Partner Violence: Not on file    Family History  Problem Relation Age of Onset   Heart attack Father    Heart failure Father     Allergies  Allergen Reactions   Ace Inhibitors Swelling  Other reaction(Emilee Market): Angioedema, Other (See Comments), Other (see comments) Other reaction(Jerrilyn Messinger): Other (See Comments) Angioedema Other reaction(Laiken Nohr): Other (See Comments) Angioedema Other reaction(Kaemon Barnett): Other (See Comments) Angioedema    Lisinopril Other (See Comments) and Swelling    Angioedema   Shellfish Allergy Anaphylaxis   Penicillins Rash    Other reaction(Averlee Swartz): Other (See Comments) "hard spot" if injected Other reaction(Kylan Liberati): Other (See Comments) "hard spot" if injected Other reaction(Doloros Kwolek): Other (See Comments) "hard spot" if injected Other reaction(Neftaly Swiss): Other (See Comments) "hard spot" if injected     Outpatient Medications Prior to Visit  Medication Sig   amLODipine (NORVASC) 10 MG tablet TAKE 1 TABLET BY MOUTH EVERY DAY    Apoaequorin (PREVAGEN PO) Take 1 capsule by mouth daily.   budesonide-formoterol (SYMBICORT) 80-4.5 MCG/ACT inhaler Inhale 2 puffs into the lungs 2 (two) times daily.   carvedilol (COREG) 25 MG tablet Take 1 tablet (25 mg total) by mouth 2 (two) times daily.   clobetasol cream (TEMOVATE) 0.05 % APPLY 1 APPLICATION ONCE DAILY FOR UP TO 5 DAYS A WEEK TO ECZEMA ON BODY. AVOID FACE, GROIN, AXILLA AS DIRECTED   colchicine 0.6 MG tablet Take 0.6 mg by mouth daily. Take 2 tablets at first sign of gout flare followed by 1 tablet after 1 hour. Max 1.8mg  within 1 hour   Crisaborole (EUCRISA) 2 % OINT Apply 1 Application topically daily. For rash at body (atopic dermatitis) (Patient not taking: Reported on 07/22/2022)   dupilumab (DUPIXENT) 300 MG/2ML prefilled syringe Inject 300 mg into the skin every 14 (fourteen) days.   EPINEPHrine 0.3 mg/0.3 mL IJ SOAJ injection Inject 0.3 mg into the muscle as needed for anaphylaxis. (Patient not taking: Reported on 07/22/2022)   febuxostat (ULORIC) 40 MG tablet TAKE 1 TABLET BY MOUTH EVERY DAY   fexofenadine (ALLEGRA) 180 MG tablet Take 1 tablet (180 mg total) by mouth daily.   fluticasone (FLONASE) 50 MCG/ACT nasal spray Place 1 spray into both nostrils daily.   montelukast (SINGULAIR) 10 MG tablet Take 1 tablet (10 mg total) by mouth daily.   omega-3 acid ethyl esters (LOVAZA) 1 g capsule Take 1 g by mouth daily. (Patient not taking: Reported on 04/21/2022)   omeprazole (PRILOSEC) 20 MG capsule Take 20 mg by mouth daily.   tamsulosin (FLOMAX) 0.4 MG CAPS capsule TAKE 1 CAPSULE BY MOUTH EVERY DAY   tiotropium (SPIRIVA) 18 MCG inhalation capsule Place 18 mcg into inhaler and inhale daily. (Patient not taking: Reported on 04/21/2022)   Facility-Administered Medications Prior to Visit  Medication Dose Route Frequency Provider   dupilumab (DUPIXENT) prefilled syringe 300 mg  300 mg Subcutaneous Q14 Days Deirdre Evener, MD    Review of Systems  Constitutional:  Negative.   HENT: Negative.    Eyes: Negative.   Respiratory: Negative.    Cardiovascular: Negative.   Gastrointestinal:  Positive for diarrhea (as in hpi).  Genitourinary: Negative.   Skin: Negative.   Neurological: Negative.   Endo/Heme/Allergies: Negative.        Objective:   BP 112/64   Pulse (!) 53   Temp 98.1 F (36.7 C)   Ht 5\' 9"  (1.753 m)   Wt 148 lb (67.1 kg)   SpO2 97%   BMI 21.86 kg/m   Vitals:   04/26/23 1034  BP: 112/64  Pulse: (!) 53  Temp: 98.1 F (36.7 C)  Height: 5\' 9"  (1.753 m)  Weight: 148 lb (67.1 kg)  SpO2: 97%  BMI (Calculated): 21.85    Physical Exam Vitals  reviewed.  Constitutional:      Appearance: Normal appearance.  HENT:     Head: Normocephalic.     Left Ear: There is no impacted cerumen.     Nose: Nose normal.     Mouth/Throat:     Mouth: Mucous membranes are moist.     Pharynx: No posterior oropharyngeal erythema.  Eyes:     Extraocular Movements: Extraocular movements intact.     Pupils: Pupils are equal, round, and reactive to light.  Cardiovascular:     Rate and Rhythm: Regular rhythm.     Chest Wall: PMI is not displaced.     Pulses: Normal pulses.     Heart sounds: Normal heart sounds. No murmur heard. Pulmonary:     Effort: Pulmonary effort is normal.     Breath sounds: Normal air entry. Examination of the right-lower field reveals rales. Examination of the left-lower field reveals rales. Rales present. No rhonchi.     Comments: Fine bibasal Abdominal:     General: Abdomen is flat. Bowel sounds are normal. There is no distension.     Palpations: Abdomen is soft. There is no hepatomegaly, splenomegaly or mass.     Tenderness: There is no abdominal tenderness.  Musculoskeletal:        General: Normal range of motion.     Cervical back: Normal range of motion and neck supple.     Right lower leg: No edema.     Left lower leg: No edema.  Skin:    General: Skin is warm and dry.  Neurological:     General: No  focal deficit present.     Mental Status: He is alert and oriented to person, place, and time.     Cranial Nerves: No cranial nerve deficit.     Motor: No weakness.  Psychiatric:        Mood and Affect: Mood normal.        Behavior: Behavior normal.      No results found for any visits on 04/26/23.  Recent Results (from the past 2160 hours)  CK     Status: None   Collection Time: 04/22/23  8:56 AM  Result Value Ref Range   Total CK 113 30 - 208 U/L  Lipid panel     Status: None   Collection Time: 04/22/23  8:56 AM  Result Value Ref Range   Cholesterol, Total 160 100 - 199 mg/dL   Triglycerides 94 0 - 149 mg/dL   HDL 73 >28 mg/dL   VLDL Cholesterol Cal 17 5 - 40 mg/dL   LDL Chol Calc (NIH) 70 0 - 99 mg/dL   Chol/HDL Ratio 2.2 0.0 - 5.0 ratio    Comment:                                   T. Chol/HDL Ratio                                             Men  Women                               1/2 Avg.Risk  3.4    3.3  Avg.Risk  5.0    4.4                                2X Avg.Risk  9.6    7.1                                3X Avg.Risk 23.4   11.0   Comprehensive metabolic panel     Status: Abnormal   Collection Time: 04/22/23  8:56 AM  Result Value Ref Range   Glucose 89 70 - 99 mg/dL   BUN 12 8 - 27 mg/dL   Creatinine, Ser 1.61 0.76 - 1.27 mg/dL   eGFR 57 (L) >09 UE/AVW/0.98   BUN/Creatinine Ratio 10 10 - 24   Sodium 139 134 - 144 mmol/L   Potassium 3.7 3.5 - 5.2 mmol/L   Chloride 97 96 - 106 mmol/L   CO2 25 20 - 29 mmol/L   Calcium 9.3 8.6 - 10.2 mg/dL   Total Protein 6.8 6.0 - 8.5 g/dL   Albumin 4.0 3.7 - 4.7 g/dL   Globulin, Total 2.8 1.5 - 4.5 g/dL   Bilirubin Total 0.6 0.0 - 1.2 mg/dL   Alkaline Phosphatase 67 44 - 121 IU/L   AST 18 0 - 40 IU/L   ALT 6 0 - 44 IU/L      Assessment & Plan:  As per problem list, advised to drink 1-2 L of fluid per day including electrolyte solutions eg pedialyte or Gatorade. 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   Total time spent: 20 minutes  Luna Fuse, MD  04/26/2023   This document may have been prepared by [facility] Voice Recognition software and as such may include unintentional dictation errors.

## 2023-05-06 ENCOUNTER — Ambulatory Visit

## 2023-05-06 DIAGNOSIS — L209 Atopic dermatitis, unspecified: Secondary | ICD-10-CM | POA: Diagnosis not present

## 2023-05-06 NOTE — Progress Notes (Addendum)
 Patient here today for two week Dupixent injection for severe Atopic Dermatitis.    Dupixent 300mg  injected into patient left upper arm. Patient tolerated well.     LOT: [id number] EXP: 05/10/2024 NDC: 0454-0981-19   Dorathy Daft, RMA

## 2023-05-19 ENCOUNTER — Other Ambulatory Visit: Payer: Self-pay

## 2023-05-19 DIAGNOSIS — L2081 Atopic neurodermatitis: Secondary | ICD-10-CM

## 2023-05-19 MED ORDER — DUPIXENT 300 MG/2ML ~~LOC~~ SOSY
300.0000 mg | PREFILLED_SYRINGE | SUBCUTANEOUS | 3 refills | Status: DC
Start: 2023-05-19 — End: 2023-11-03

## 2023-05-19 NOTE — Progress Notes (Signed)
 Fresh prescription to [facility] for 2025 year. aw

## 2023-05-20 ENCOUNTER — Ambulatory Visit

## 2023-05-20 DIAGNOSIS — L209 Atopic dermatitis, unspecified: Secondary | ICD-10-CM

## 2023-05-20 MED ORDER — DUPILUMAB 300 MG/2ML ~~LOC~~ SOSY
300.0000 mg | PREFILLED_SYRINGE | Freq: Once | SUBCUTANEOUS | Status: AC
Start: 2023-05-20 — End: 2023-05-20
  Administered 2023-05-20: 300 mg via SUBCUTANEOUS

## 2023-05-20 NOTE — Progress Notes (Signed)
 Patient here today for two week Dupixent injection for severe Atopic Dermatitis.    Dupixent 300mg  injected into patient R upper arm. Patient tolerated well.     LOT: [id number] EXP: 09/09/2024 NDC: 8657-8469-62   Cari Caraway CMA, AAMA

## 2023-06-02 ENCOUNTER — Ambulatory Visit

## 2023-06-02 DIAGNOSIS — L209 Atopic dermatitis, unspecified: Secondary | ICD-10-CM

## 2023-06-02 NOTE — Progress Notes (Signed)
 Patient here today for two week Dupixent  injection for severe Atopic Dermatitis.    Dupixent  300mg  injected into patient LEFT upper arm. Patient tolerated well.     LOT: [id number] EXP: 12/2024 NDC: 9147-8295-62   Cooper Denver RMA

## 2023-06-03 ENCOUNTER — Ambulatory Visit

## 2023-06-16 ENCOUNTER — Ambulatory Visit

## 2023-06-16 DIAGNOSIS — L209 Atopic dermatitis, unspecified: Secondary | ICD-10-CM

## 2023-06-16 NOTE — Progress Notes (Signed)
 Patient here today for two week Dupixent  injection for severe Atopic Dermatitis.    Dupixent  300mg  injected into patient RIGHT upper arm. Patient tolerated well.     LOT: [id number] EXP: 06/2025 NDC: 2130-8657-84   Cooper Denver RMA

## 2023-06-30 ENCOUNTER — Ambulatory Visit (INDEPENDENT_AMBULATORY_CARE_PROVIDER_SITE_OTHER)

## 2023-06-30 DIAGNOSIS — L209 Atopic dermatitis, unspecified: Secondary | ICD-10-CM | POA: Diagnosis not present

## 2023-06-30 NOTE — Progress Notes (Signed)
 Patient here today for two week Dupixent  injection for severe Atopic Dermatitis.    Dupixent  300mg  injected into patient LEFT upper arm. Patient tolerated well.     LOT: [id number] EXP: 05/2025 NDC: 9562-1308-65   Cooper Denver RMA

## 2023-07-14 ENCOUNTER — Ambulatory Visit

## 2023-07-14 DIAGNOSIS — L209 Atopic dermatitis, unspecified: Secondary | ICD-10-CM

## 2023-07-14 NOTE — Progress Notes (Signed)
 Patient here today for two week Dupixent  injection for severe Atopic Dermatitis.    Dupixent  300mg  injected into patient right upper arm. Patient tolerated well.     LOT: [id number] EXP: 07/09/2024 NDC: 0454-0981-19   Lisbeth Rides, RMA

## 2023-07-28 ENCOUNTER — Ambulatory Visit

## 2023-07-28 DIAGNOSIS — L209 Atopic dermatitis, unspecified: Secondary | ICD-10-CM | POA: Diagnosis not present

## 2023-07-28 NOTE — Progress Notes (Signed)
 Patient here today for two week Dupixent  injection for severe Atopic Dermatitis.    Dupixent  300mg  injected into patient left upper arm. Patient tolerated well.     LOT: [id number] EXP:05/2025 NDC: 0454-0981-19   Lisbeth Rides, RMA

## 2023-08-03 ENCOUNTER — Encounter: Payer: Self-pay | Admitting: Internal Medicine

## 2023-08-03 ENCOUNTER — Ambulatory Visit (INDEPENDENT_AMBULATORY_CARE_PROVIDER_SITE_OTHER): Admitting: Internal Medicine

## 2023-08-03 VITALS — BP 101/62 | HR 49 | Temp 98.1°F | Ht 69.0 in | Wt 153.4 lb

## 2023-08-03 DIAGNOSIS — M1A9XX0 Chronic gout, unspecified, without tophus (tophi): Secondary | ICD-10-CM | POA: Diagnosis not present

## 2023-08-03 DIAGNOSIS — I10 Essential (primary) hypertension: Secondary | ICD-10-CM | POA: Diagnosis not present

## 2023-08-03 DIAGNOSIS — E7800 Pure hypercholesterolemia, unspecified: Secondary | ICD-10-CM | POA: Diagnosis not present

## 2023-08-03 DIAGNOSIS — J449 Chronic obstructive pulmonary disease, unspecified: Secondary | ICD-10-CM

## 2023-08-03 DIAGNOSIS — J301 Allergic rhinitis due to pollen: Secondary | ICD-10-CM

## 2023-08-03 MED ORDER — MONTELUKAST SODIUM 10 MG PO TABS: 10.0000 mg | ORAL_TABLET | Freq: Every day | ORAL | 1 refills | Status: AC

## 2023-08-03 MED ORDER — CARVEDILOL 25 MG PO TABS: 25.0000 mg | ORAL_TABLET | Freq: Two times a day (BID) | ORAL | 1 refills | Status: DC

## 2023-08-03 MED ORDER — AMLODIPINE BESYLATE 10 MG PO TABS: 10.0000 mg | ORAL_TABLET | Freq: Every day | ORAL | 1 refills | Status: DC

## 2023-08-03 NOTE — Progress Notes (Signed)
 Established Patient Office Visit  Subjective:  Patient ID: Larry Parsons, male    DOB: 08/23/1940  Age: 83 y.o. MRN: [id number]  Chief Complaint  Patient presents with   Follow-up    3 month lab results    No new complaints, here for lab review and medication refills. Failed to have previsit labs done.     No other concerns at this time.   Past Medical History:  Diagnosis Date   Arthralgia    Arthritis    Asthma    childhood   COPD (chronic obstructive pulmonary disease) (HCC)    Elevated liver enzymes    Fatty liver    GERD (gastroesophageal reflux disease)    Hepatic fibrosis    Hepatitis C    Hypertension    Positive colorectal cancer screening using Cologuard test    Stroke [facility])    2006    Past Surgical History:  Procedure Laterality Date   COLONOSCOPY N/A 07/30/2021   Procedure: COLONOSCOPY;  Surgeon: Toledo, Ladell POUR, MD;  Location: [facility];  Service: Gastroenterology;  Laterality: N/A;   COLONOSCOPY WITH PROPOFOL  N/A 03/17/2017   Procedure: COLONOSCOPY WITH PROPOFOL ;  Surgeon: Toledo, Ladell POUR, MD;  Location: [facility];  Service: Gastroenterology;  Laterality: N/A;   COLONOSCOPY WITH PROPOFOL  N/A 12/01/2018   Procedure: COLONOSCOPY WITH PROPOFOL ;  Surgeon: Toledo, Ladell POUR, MD;  Location: [facility];  Service: Gastroenterology;  Laterality: N/A;   FLEXIBLE SIGMOIDOSCOPY N/A 04/13/2017   Procedure: FLEXIBLE SIGMOIDOSCOPY;  Surgeon: Toledo, Ladell POUR, MD;  Location: [facility];  Service: Gastroenterology;  Laterality: N/A;   FLEXIBLE SIGMOIDOSCOPY N/A 10/13/2017   Procedure: FLEXIBLE SIGMOIDOSCOPY;  Surgeon: Toledo, Ladell POUR, MD;  Location: [facility];  Service: Gastroenterology;  Laterality: N/A;    Social History   Socioeconomic History   Marital status: Married    Spouse name: Not on file   Number of children: Not on file   Years of education: Not on file   Highest education level: Not on file  Occupational History   Not on  file  Tobacco Use   Smoking status: Never   Smokeless tobacco: Current    Types: Snuff  Vaping Use   Vaping status: Never Used  Substance and Sexual Activity   Alcohol use: Yes    Alcohol/week: 4.0 standard drinks of alcohol    Types: 4 Shots of liquor per week    Comment: 3x/week (1 beer) occassionally    Drug use: No   Sexual activity: Not on file  Other Topics Concern   Not on file  Social History Narrative   Not on file   Social Drivers of Health   Financial Resource Strain: Not on file  Food Insecurity: Unknown (02/25/2019)   Received from [facility]   Hunger Vital Sign    Within the past 12 months, you worried that your food would run out before you got the money to buy more.: Patient declined    Within the past 12 months, the food you bought just didn't last and you didn't have money to get more.: Patient declined  Transportation Needs: Not on file  Physical Activity: Not on file  Stress: Not on file  Social Connections: Not on file  Intimate Partner Violence: Not on file    Family History  Problem Relation Age of Onset   Heart attack Father    Heart failure Father     Allergies  Allergen Reactions   Ace Inhibitors Swelling  Other reaction(Tyquasia Pant): Angioedema, Other (See Comments), Other (see comments) Other reaction(Elgar Scoggins): Other (See Comments) Angioedema Other reaction(Mahlon Gabrielle): Other (See Comments) Angioedema Other reaction(Besse Miron): Other (See Comments) Angioedema    Lisinopril Other (See Comments) and Swelling    Angioedema   Shellfish Allergy Anaphylaxis   Penicillins Rash    Other reaction(Bethlehem Langstaff): Other (See Comments) hard spot if injected Other reaction(Kyasia Steuck): Other (See Comments) hard spot if injected Other reaction(Cortana Vanderford): Other (See Comments) hard spot if injected Other reaction(Concha Sudol): Other (See Comments) hard spot if injected     Outpatient Medications Prior to Visit  Medication Sig   amLODipine  (NORVASC ) 10 MG tablet Take 1 tablet (10 mg total) by  mouth daily.   budesonide-formoterol (SYMBICORT) 80-4.5 MCG/ACT inhaler Inhale 2 puffs into the lungs 2 (two) times daily. (Patient taking differently: Inhale 2 puffs into the lungs as needed.)   carvedilol  (COREG ) 25 MG tablet Take 1 tablet (25 mg total) by mouth 2 (two) times daily.   fexofenadine  (ALLEGRA ) 180 MG tablet Take 1 tablet (180 mg total) by mouth daily.   fluticasone  (FLONASE ) 50 MCG/ACT nasal spray Place 1 spray into both nostrils daily.   montelukast  (SINGULAIR ) 10 MG tablet Take 1 tablet (10 mg total) by mouth daily.   tamsulosin (FLOMAX) 0.4 MG CAPS capsule TAKE 1 CAPSULE BY MOUTH EVERY DAY   Apoaequorin (PREVAGEN PO) Take 1 capsule by mouth daily. (Patient not taking: Reported on 08/03/2023)   clobetasol  cream (TEMOVATE ) 0.05 % APPLY 1 APPLICATION ONCE DAILY FOR UP TO 5 DAYS A WEEK TO ECZEMA ON BODY. AVOID FACE, GROIN, AXILLA AS DIRECTED (Patient not taking: Reported on 08/03/2023)   colchicine 0.6 MG tablet Take 0.6 mg by mouth daily. Take 2 tablets at first sign of gout flare followed by 1 tablet after 1 hour. Max 1.8mg  within 1 hour (Patient not taking: Reported on 08/03/2023)   Crisaborole  (EUCRISA ) 2 % OINT Apply 1 Application topically daily. For rash at body (atopic dermatitis) (Patient not taking: Reported on 08/03/2023)   dupilumab  (DUPIXENT ) 300 MG/2ML prefilled syringe Inject 300 mg into the skin every 14 (fourteen) days. (Patient not taking: Reported on 08/03/2023)   EPINEPHrine  0.3 mg/0.3 mL IJ SOAJ injection Inject 0.3 mg into the muscle as needed for anaphylaxis. (Patient not taking: Reported on 08/03/2023)   febuxostat  (ULORIC ) 40 MG tablet Take 1 tablet (40 mg total) by mouth daily.   omega-3 acid ethyl esters (LOVAZA) 1 g capsule Take 1 g by mouth daily. (Patient not taking: Reported on 08/03/2023)   omeprazole (PRILOSEC) 20 MG capsule Take 20 mg by mouth daily. (Patient not taking: Reported on 08/03/2023)   tiotropium (SPIRIVA) 18 MCG inhalation capsule Place 18 mcg into  inhaler and inhale daily. (Patient not taking: Reported on 08/03/2023)   Facility-Administered Medications Prior to Visit  Medication Dose Route Frequency Provider   dupilumab  (DUPIXENT ) prefilled syringe 300 mg  300 mg Subcutaneous Q14 Days Hester Alm BROCKS, MD    Review of Systems  Constitutional: Negative.   HENT: Negative.    Eyes: Negative.   Respiratory: Negative.    Cardiovascular: Negative.   Gastrointestinal:  Positive for diarrhea (as in hpi).  Genitourinary: Negative.   Skin: Negative.   Neurological: Negative.   Endo/Heme/Allergies: Negative.        Objective:   BP 101/62   Pulse (!) 49   Temp 98.1 F (36.7 C)   Ht 5' 9 (1.753 m)   Wt 153 lb 6.4 oz (69.6 kg)   SpO2 96%   BMI  22.65 kg/m   Vitals:   08/03/23 1003  BP: 101/62  Pulse: (!) 49  Temp: 98.1 F (36.7 C)  Height: 5' 9 (1.753 m)  Weight: 153 lb 6.4 oz (69.6 kg)  SpO2: 96%  BMI (Calculated): 22.64    Physical Exam Vitals reviewed.  Constitutional:      Appearance: Normal appearance.  HENT:     Head: Normocephalic.     Left Ear: There is no impacted cerumen.     Nose: Nose normal.     Mouth/Throat:     Mouth: Mucous membranes are moist.     Pharynx: No posterior oropharyngeal erythema.   Eyes:     Extraocular Movements: Extraocular movements intact.     Pupils: Pupils are equal, round, and reactive to light.    Cardiovascular:     Rate and Rhythm: Regular rhythm.     Chest Wall: PMI is not displaced.     Pulses: Normal pulses.     Heart sounds: Normal heart sounds. No murmur heard. Pulmonary:     Effort: Pulmonary effort is normal.     Breath sounds: Normal air entry. Examination of the right-lower field reveals rales. Examination of the left-lower field reveals rales. Rales present. No rhonchi.     Comments: Fine bibasal Abdominal:     General: Abdomen is flat. Bowel sounds are normal. There is no distension.     Palpations: Abdomen is soft. There is no hepatomegaly,  splenomegaly or mass.     Tenderness: There is no abdominal tenderness.   Musculoskeletal:        General: Normal range of motion.     Cervical back: Normal range of motion and neck supple.     Right lower leg: No edema.     Left lower leg: No edema.   Skin:    General: Skin is warm and dry.   Neurological:     General: No focal deficit present.     Mental Status: He is alert and oriented to person, place, and time.     Cranial Nerves: No cranial nerve deficit.     Motor: No weakness.   Psychiatric:        Mood and Affect: Mood normal.        Behavior: Behavior normal.      No results found for any visits on 08/03/23.  No results found for this or any previous visit (from the past 2160 hours).    Assessment & Plan:  As per problem list  Problem List Items Addressed This Visit       Cardiovascular and Mediastinum   Primary hypertension - Primary     Respiratory   Chronic obstructive pulmonary disease (HCC)     Other   Chronic gout without tophus   Pure hypercholesterolemia    No follow-ups on file.   Total time spent: 20 minutes  Sherrill Cinderella Perry, MD  08/03/2023   This document may have been prepared by [facility] Voice Recognition software and as such may include unintentional dictation errors.

## 2023-08-05 ENCOUNTER — Other Ambulatory Visit

## 2023-08-05 DIAGNOSIS — E7800 Pure hypercholesterolemia, unspecified: Secondary | ICD-10-CM

## 2023-08-06 LAB — COMPREHENSIVE METABOLIC PANEL WITH GFR
ALT: 11 IU/L (ref 0–44)
AST: 14 IU/L (ref 0–40)
Albumin: 4.1 g/dL (ref 3.7–4.7)
Alkaline Phosphatase: 50 IU/L (ref 44–121)
BUN/Creatinine Ratio: 10 (ref 10–24)
BUN: 10 mg/dL (ref 8–27)
Bilirubin Total: 0.4 mg/dL (ref 0.0–1.2)
CO2: 25 mmol/L (ref 20–29)
Calcium: 9.3 mg/dL (ref 8.6–10.2)
Chloride: 103 mmol/L (ref 96–106)
Creatinine, Ser: 0.97 mg/dL (ref 0.76–1.27)
Globulin, Total: 2.7 g/dL (ref 1.5–4.5)
Glucose: 93 mg/dL (ref 70–99)
Potassium: 4.1 mmol/L (ref 3.5–5.2)
Sodium: 143 mmol/L (ref 134–144)
Total Protein: 6.8 g/dL (ref 6.0–8.5)
eGFR: 77 mL/min/{1.73_m2} (ref >59)

## 2023-08-06 LAB — LIPID PANEL
Chol/HDL Ratio: 2.3 ratio (ref 0.0–5.0)
Cholesterol, Total: 181 mg/dL (ref 100–199)
HDL: 80 mg/dL (ref >39)
LDL Chol Calc (NIH): 89 mg/dL (ref 0–99)
Triglycerides: 60 mg/dL (ref 0–149)
VLDL Cholesterol Cal: 12 mg/dL (ref 5–40)

## 2023-08-06 LAB — CK: Total CK: 131 U/L (ref 30–208)

## 2023-08-10 ENCOUNTER — Ambulatory Visit: Payer: Self-pay | Admitting: Internal Medicine

## 2023-08-10 ENCOUNTER — Ambulatory Visit (INDEPENDENT_AMBULATORY_CARE_PROVIDER_SITE_OTHER): Admitting: Internal Medicine

## 2023-08-10 ENCOUNTER — Encounter: Payer: Self-pay | Admitting: Internal Medicine

## 2023-08-10 VITALS — BP 100/68 | HR 83 | Temp 97.8°F | Ht 69.0 in | Wt 152.2 lb

## 2023-08-10 DIAGNOSIS — J8410 Pulmonary fibrosis, unspecified: Secondary | ICD-10-CM | POA: Diagnosis not present

## 2023-08-10 DIAGNOSIS — Z0130 Encounter for examination of blood pressure without abnormal findings: Secondary | ICD-10-CM

## 2023-08-10 DIAGNOSIS — E7800 Pure hypercholesterolemia, unspecified: Secondary | ICD-10-CM | POA: Diagnosis not present

## 2023-08-10 DIAGNOSIS — J301 Allergic rhinitis due to pollen: Secondary | ICD-10-CM

## 2023-08-10 DIAGNOSIS — M1A9XX0 Chronic gout, unspecified, without tophus (tophi): Secondary | ICD-10-CM

## 2023-08-10 MED ORDER — FLUTICASONE PROPIONATE 50 MCG/ACT NA SUSP: 1.0000 | Freq: Every day | NASAL | 2 refills | Status: DC

## 2023-08-10 MED ORDER — FEXOFENADINE HCL 180 MG PO TABS: 180.0000 mg | ORAL_TABLET | Freq: Every day | ORAL | 1 refills | Status: DC

## 2023-08-10 MED ORDER — FEBUXOSTAT 40 MG PO TABS
40.0000 mg | ORAL_TABLET | Freq: Every day | ORAL | 0 refills | Status: DC
Start: 2023-08-10 — End: 2023-12-28

## 2023-08-10 NOTE — Progress Notes (Signed)
 Established Patient Office Visit  Subjective:  Patient ID: Larry Parsons, male    DOB: [birth year]  Age: 83 y.o. MRN: [id number]  Chief Complaint  Patient presents with   Follow-up    1 week lab results    No new complaints, here for lab review and medication refills. Labs reviewed and notable for well controlled lipids and improvement in gfr to whiche he admits improved fluid intake.    No other concerns at this time.   Past Medical History:  Diagnosis Date   Arthralgia    Arthritis    Asthma    childhood   COPD (chronic obstructive pulmonary disease) (HCC)    Elevated liver enzymes    Fatty liver    GERD (gastroesophageal reflux disease)    Hepatic fibrosis    Hepatitis C    Hypertension    Positive colorectal cancer screening using Cologuard test    Stroke [facility])    2006    Past Surgical History:  Procedure Laterality Date   COLONOSCOPY N/A 07/30/2021   Procedure: COLONOSCOPY;  Surgeon: Toledo, Ladell POUR, MD;  Location: [facility] ENDOSCOPY;  Service: Gastroenterology;  Laterality: N/A;   COLONOSCOPY WITH PROPOFOL  N/A 03/17/2017   Procedure: COLONOSCOPY WITH PROPOFOL ;  Surgeon: Toledo, Ladell POUR, MD;  Location: [facility] ENDOSCOPY;  Service: Gastroenterology;  Laterality: N/A;   COLONOSCOPY WITH PROPOFOL  N/A 12/01/2018   Procedure: COLONOSCOPY WITH PROPOFOL ;  Surgeon: Toledo, Ladell POUR, MD;  Location: [facility] ENDOSCOPY;  Service: Gastroenterology;  Laterality: N/A;   FLEXIBLE SIGMOIDOSCOPY N/A 04/13/2017   Procedure: FLEXIBLE SIGMOIDOSCOPY;  Surgeon: Toledo, Ladell POUR, MD;  Location: [facility] ENDOSCOPY;  Service: Gastroenterology;  Laterality: N/A;   FLEXIBLE SIGMOIDOSCOPY N/A 10/13/2017   Procedure: FLEXIBLE SIGMOIDOSCOPY;  Surgeon: Toledo, Ladell POUR, MD;  Location: [facility] ENDOSCOPY;  Service: Gastroenterology;  Laterality: N/A;    Social History   Socioeconomic History   Marital status: Married    Spouse name: Not on file   Number of children: Not on file   Years of education: Not on  file   Highest education level: Not on file  Occupational History   Not on file  Tobacco Use   Smoking status: Never   Smokeless tobacco: Current    Types: Snuff  Vaping Use   Vaping status: Never Used  Substance and Sexual Activity   Alcohol use: Yes    Alcohol/week: 4.0 standard drinks of alcohol    Types: 4 Shots of liquor per week    Comment: 3x/week (1 beer) occassionally    Drug use: No   Sexual activity: Not on file  Other Topics Concern   Not on file  Social History Narrative   Not on file   Social Drivers of Health   Financial Resource Strain: Not on file  Food Insecurity: Unknown (02/25/2019)   Received from [facility]   Hunger Vital Sign    Within the past 12 months, you worried that your food would run out before you got the money to buy more.: Patient declined    Within the past 12 months, the food you bought just didn't last and you didn't have money to get more.: Patient declined  Transportation Needs: Not on file  Physical Activity: Not on file  Stress: Not on file  Social Connections: Not on file  Intimate Partner Violence: Not on file    Family History  Problem Relation Age of Onset   Heart attack Father    Heart failure Father  Allergies  Allergen Reactions   Ace Inhibitors Swelling    Other reaction(Callaghan Laverdure): Angioedema, Other (See Comments), Other (see comments) Other reaction(Taiwan Talcott): Other (See Comments) Angioedema Other reaction(Gilmar Bua): Other (See Comments) Angioedema Other reaction(Terris Bodin): Other (See Comments) Angioedema    Lisinopril Other (See Comments) and Swelling    Angioedema   Shellfish Allergy Anaphylaxis   Penicillins Rash    Other reaction(Tekoa Hamor): Other (See Comments) hard spot if injected Other reaction(Gevork Ayyad): Other (See Comments) hard spot if injected Other reaction(Inayah Woodin): Other (See Comments) hard spot if injected Other reaction(Keshana Klemz): Other (See Comments) hard spot if injected     Outpatient Medications Prior to Visit   Medication Sig   amLODipine  (NORVASC ) 10 MG tablet Take 1 tablet (10 mg total) by mouth daily.   budesonide-formoterol (SYMBICORT) 80-4.5 MCG/ACT inhaler Inhale 2 puffs into the lungs 2 (two) times daily. (Patient taking differently: Inhale 2 puffs into the lungs as needed.)   carvedilol  (COREG ) 25 MG tablet Take 1 tablet (25 mg total) by mouth 2 (two) times daily.   montelukast  (SINGULAIR ) 10 MG tablet Take 1 tablet (10 mg total) by mouth daily.   tamsulosin (FLOMAX) 0.4 MG CAPS capsule TAKE 1 CAPSULE BY MOUTH EVERY DAY   [DISCONTINUED] febuxostat  (ULORIC ) 40 MG tablet Take 1 tablet (40 mg total) by mouth daily.   [DISCONTINUED] fexofenadine  (ALLEGRA ) 180 MG tablet Take 1 tablet (180 mg total) by mouth daily.   [DISCONTINUED] fluticasone  (FLONASE ) 50 MCG/ACT nasal spray Place 1 spray into both nostrils daily.   Apoaequorin (PREVAGEN PO) Take 1 capsule by mouth daily. (Patient not taking: Reported on 08/10/2023)   clobetasol  cream (TEMOVATE ) 0.05 % APPLY 1 APPLICATION ONCE DAILY FOR UP TO 5 DAYS A WEEK TO ECZEMA ON BODY. AVOID FACE, GROIN, AXILLA AS DIRECTED (Patient not taking: Reported on 08/10/2023)   colchicine 0.6 MG tablet Take 0.6 mg by mouth daily. Take 2 tablets at first sign of gout flare followed by 1 tablet after 1 hour. Max 1.8mg  within 1 hour (Patient not taking: Reported on 08/10/2023)   Crisaborole  (EUCRISA ) 2 % OINT Apply 1 Application topically daily. For rash at body (atopic dermatitis) (Patient not taking: Reported on 08/10/2023)   dupilumab  (DUPIXENT ) 300 MG/2ML prefilled syringe Inject 300 mg into the skin every 14 (fourteen) days. (Patient not taking: Reported on 08/10/2023)   EPINEPHrine  0.3 mg/0.3 mL IJ SOAJ injection Inject 0.3 mg into the muscle as needed for anaphylaxis. (Patient not taking: Reported on 08/10/2023)   omega-3 acid ethyl esters (LOVAZA) 1 g capsule Take 1 g by mouth daily. (Patient not taking: Reported on 08/10/2023)   omeprazole (PRILOSEC) 20 MG capsule Take 20 mg by  mouth daily. (Patient not taking: Reported on 08/10/2023)   tiotropium (SPIRIVA) 18 MCG inhalation capsule Place 18 mcg into inhaler and inhale daily. (Patient not taking: Reported on 08/10/2023)   Facility-Administered Medications Prior to Visit  Medication Dose Route Frequency Provider   dupilumab  (DUPIXENT ) prefilled syringe 300 mg  300 mg Subcutaneous Q14 Days Hester Alm BROCKS, MD    Review of Systems  Constitutional: Negative.   HENT: Negative.    Eyes: Negative.   Respiratory: Negative.    Cardiovascular: Negative.   Gastrointestinal:  Positive for diarrhea (as in hpi).  Genitourinary: Negative.   Skin: Negative.   Neurological: Negative.   Endo/Heme/Allergies: Negative.        Objective:   BP 100/68   Pulse 83   Temp 97.8 F (36.6 C)   Ht 5' 9 (1.753 m)  Wt 152 lb 3.2 oz (69 kg)   SpO2 94%   BMI 22.48 kg/m   Vitals:   08/10/23 1015  BP: 100/68  Pulse: 83  Temp: 97.8 F (36.6 C)  Height: 5' 9 (1.753 m)  Weight: 152 lb 3.2 oz (69 kg)  SpO2: 94%  BMI (Calculated): 22.47    Physical Exam Vitals reviewed.  Constitutional:      Appearance: Normal appearance.  HENT:     Head: Normocephalic.     Left Ear: There is no impacted cerumen.     Nose: Nose normal.     Mouth/Throat:     Mouth: Mucous membranes are moist.     Pharynx: No posterior oropharyngeal erythema.   Eyes:     Extraocular Movements: Extraocular movements intact.     Pupils: Pupils are equal, round, and reactive to light.    Cardiovascular:     Rate and Rhythm: Regular rhythm.     Chest Wall: PMI is not displaced.     Pulses: Normal pulses.     Heart sounds: Normal heart sounds. No murmur heard. Pulmonary:     Effort: Pulmonary effort is normal.     Breath sounds: Normal air entry. Examination of the right-lower field reveals rales. Examination of the left-lower field reveals rales. Rales present. No rhonchi.     Comments: Fine bibasal Abdominal:     General: Abdomen is flat. Bowel  sounds are normal. There is no distension.     Palpations: Abdomen is soft. There is no hepatomegaly, splenomegaly or mass.     Tenderness: There is no abdominal tenderness.   Musculoskeletal:        General: Normal range of motion.     Cervical back: Normal range of motion and neck supple.     Right lower leg: No edema.     Left lower leg: No edema.   Skin:    General: Skin is warm and dry.   Neurological:     General: No focal deficit present.     Mental Status: He is alert and oriented to person, place, and time.     Cranial Nerves: No cranial nerve deficit.     Motor: No weakness.   Psychiatric:        Mood and Affect: Mood normal.        Behavior: Behavior normal.      No results found for any visits on 08/10/23.  Recent Results (from the past 2160 hours)  Comprehensive metabolic panel     Status: None   Collection Time: 08/05/23  8:41 AM  Result Value Ref Range   Glucose 93 70 - 99 mg/dL   BUN 10 8 - 27 mg/dL   Creatinine, Ser 9.02 0.76 - 1.27 mg/dL   eGFR 77 >40 fO/fpw/8.26   BUN/Creatinine Ratio 10 10 - 24   Sodium 143 134 - 144 mmol/L   Potassium 4.1 3.5 - 5.2 mmol/L   Chloride 103 96 - 106 mmol/L   CO2 25 20 - 29 mmol/L   Calcium 9.3 8.6 - 10.2 mg/dL   Total Protein 6.8 6.0 - 8.5 g/dL   Albumin 4.1 3.7 - 4.7 g/dL   Globulin, Total 2.7 1.5 - 4.5 g/dL   Bilirubin Total 0.4 0.0 - 1.2 mg/dL   Alkaline Phosphatase 50 44 - 121 IU/L   AST 14 0 - 40 IU/L   ALT 11 0 - 44 IU/L  CK     Status: None   Collection Time: 08/05/23  8:41 AM  Result Value Ref Range   Total CK 131 30 - 208 U/L  Lipid panel     Status: None   Collection Time: 08/05/23  8:41 AM  Result Value Ref Range   Cholesterol, Total 181 100 - 199 mg/dL   Triglycerides 60 0 - 149 mg/dL   HDL 80 >60 mg/dL   VLDL Cholesterol Cal 12 5 - 40 mg/dL   LDL Chol Calc (NIH) 89 0 - 99 mg/dL   Chol/HDL Ratio 2.3 0.0 - 5.0 ratio    Comment:                                   T. 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   Total time spent: 20 minutes  Sherrill Cinderella Perry, MD  08/10/2023   This document may have been prepared by [facility] Voice Recognition software and as such may include unintentional dictation errors.

## 2023-08-11 ENCOUNTER — Ambulatory Visit (INDEPENDENT_AMBULATORY_CARE_PROVIDER_SITE_OTHER)

## 2023-08-11 DIAGNOSIS — L209 Atopic dermatitis, unspecified: Secondary | ICD-10-CM | POA: Diagnosis not present

## 2023-08-11 NOTE — Progress Notes (Signed)
 Patient here today for two week Dupixent  injection for severe Atopic Dermatitis.    Dupixent  300mg  injected into patient right upper arm. Patient tolerated well.     LOT: [id number] EXP:07/2025 NDC: 9975-4085-97   Stefano Saba RMA

## 2023-08-24 ENCOUNTER — Ambulatory Visit (INDEPENDENT_AMBULATORY_CARE_PROVIDER_SITE_OTHER): Admitting: Dermatology

## 2023-08-24 ENCOUNTER — Encounter: Payer: Self-pay | Admitting: Dermatology

## 2023-08-24 DIAGNOSIS — Z79899 Other long term (current) drug therapy: Secondary | ICD-10-CM

## 2023-08-24 DIAGNOSIS — L819 Disorder of pigmentation, unspecified: Secondary | ICD-10-CM | POA: Diagnosis not present

## 2023-08-24 DIAGNOSIS — L209 Atopic dermatitis, unspecified: Secondary | ICD-10-CM | POA: Diagnosis not present

## 2023-08-24 DIAGNOSIS — L2081 Atopic neurodermatitis: Secondary | ICD-10-CM

## 2023-08-24 DIAGNOSIS — Z7189 Other specified counseling: Secondary | ICD-10-CM

## 2023-08-24 MED ORDER — DUPILUMAB 300 MG/2ML ~~LOC~~ SOSY
300.0000 mg | PREFILLED_SYRINGE | SUBCUTANEOUS | Status: AC
  Administered 2023-08-24 – 2024-01-26 (×13): 300 mg via SUBCUTANEOUS

## 2023-08-24 NOTE — Patient Instructions (Signed)

## 2023-08-24 NOTE — Progress Notes (Signed)
 Follow-Up Visit   Subjective  Larry Parsons is a 83 y.o. male who presents for the following: Atopic Derm, trunk, extremities, Dupixent  sq injections, Clobetasol  cr prn itching, Eucrisa  oint after shower, pt feels like he is improving, pt would like to know if any other treatment other then having to come in for Dupixent  injections  The following portions of the chart were reviewed this encounter and updated as appropriate: medications, allergies, medical history  Review of Systems:  No other skin or systemic complaints except as noted in HPI or Assessment and Plan.  Objective  Well appearing patient in no apparent distress; mood and affect are within normal limits.  A focused examination was performed of the following areas: Trunk, arms  Relevant exam findings are noted in the Assessment and Plan.    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  Dyschromia remains to a degree, but improved. Trunk, extremities Exam: some patchy hyperpigmentation trunk, lichenification resolved 5% BSA Chronic and persistent condition with duration or expected duration over one year. Condition is improving with treatment but not currently at goal. Atopic dermatitis - Severe, on Dupixent  (biologic medication).  Atopic dermatitis (eczema) is a chronic, relapsing, pruritic condition that can significantly affect quality of life. It is often associated with allergic rhinitis and/or asthma and can require treatment with topical medications, phototherapy, or in severe cases biologic medications, which require long term medication management.   Treatment Plan: Cont Dupixent  300mg /45ml sq injections q 2 wks Pt wondered about other treatment where he would not have to come in for shots every 2 weeks.  Advised he has always had option of doing shots on his own or someone to help him at home.  He does not think that is an option.   Advised theat Dupixent  is working well and not other med would be as safe and work as well as Dupixent  for him.  Recommend he continue Dupixent .  He agrees. Dupixent  300mg /70ml sq injection to L upper arm, sample x 1 Lot [id number] 05/2025 Cont Clobetasol  cr qd 5d/wk prn itching, avoid face, groin, axilla Cont Eucrisa  oint qd after shower to trunk, extremities  Recommend gentle skin care.  Dupilumab  (Dupixent ) is a treatment given by injection for adults and children with moderate-to-severe atopic dermatitis. Goal is control of skin condition, not cure. It is given as 2 injections at the first dose followed by 1 injection ever 2 weeks thereafter.  Young children are dosed monthly.  Potential side effects include allergic reaction, herpes infections, injection site reactions and conjunctivitis (inflammation of the eyes).  The use of Dupixent  requires long term medication management, including periodic office visits.   Topical steroids (such as triamcinolone , fluocinolone, fluocinonide, mometasone, clobetasol , halobetasol, betamethasone, hydrocortisone) can cause thinning and lightening of the skin if they are used for too long in the same area. Your physician has selected the right strength medicine for your problem and area affected on the body. Please use your medication only as directed by your physician to prevent side effects.   ATOPIC NEURODERMATITIS   Related Medications clobetasol  cream (TEMOVATE ) 0.05 % APPLY 1 APPLICATION ONCE DAILY FOR UP TO 5 DAYS A WEEK TO ECZEMA ON BODY. AVOID FACE, GROIN, AXILLA AS DIRECTED dupilumab  (DUPIXENT ) prefilled syringe 300 mg  dupilumab  (DUPIXENT ) 300 MG/2ML prefilled syringe Inject 300 mg into the skin every 14 (fourteen) days. dupilumab  (DUPIXENT ) prefilled syringe 300 mg  DYSCHROMIA   COUNSELING AND  COORDINATION OF CARE   MEDICATION MANAGEMENT   LONG-TERM USE OF HIGH-RISK MEDICATION    Return in about 2 weeks (around 09/07/2023) for nurse  Dupixent  injection, 71m Atopic derm f/u.  I, Grayce Saunas, RMA, am acting as scribe for Alm Rhyme, MD .   Documentation: I have reviewed the above documentation for accuracy and completeness, and I agree with the above.  Alm Rhyme, MD

## 2023-09-07 ENCOUNTER — Ambulatory Visit (INDEPENDENT_AMBULATORY_CARE_PROVIDER_SITE_OTHER)

## 2023-09-07 DIAGNOSIS — L2081 Atopic neurodermatitis: Secondary | ICD-10-CM

## 2023-09-07 NOTE — Progress Notes (Signed)
 Patient here today for two week Dupixent  injection for severe Atopic Dermatitis.    Dupixent  300mg  injected into patient right upper arm. Patient tolerated well.     LOT: [id number] EXP:06/09/2025 NDC: 9975-4085-97   Alan Pizza, RMA

## 2023-09-21 ENCOUNTER — Ambulatory Visit

## 2023-09-21 DIAGNOSIS — L2081 Atopic neurodermatitis: Secondary | ICD-10-CM

## 2023-09-21 NOTE — Progress Notes (Signed)
 Patient here today for two week Dupixent  injection for severe Atopic Dermatitis.    Dupixent  300mg  injected into patient left upper arm. Patient tolerated well.     LOT: [id number]  EXP: 02/08/2025 NDC: 9975-4085-97   Alan Pizza, RMA

## 2023-10-05 ENCOUNTER — Ambulatory Visit (INDEPENDENT_AMBULATORY_CARE_PROVIDER_SITE_OTHER)

## 2023-10-05 DIAGNOSIS — L2081 Atopic neurodermatitis: Secondary | ICD-10-CM | POA: Diagnosis not present

## 2023-10-05 NOTE — Progress Notes (Addendum)
 Patient here today for two week Dupixent  injection for severe Atopic Dermatitis.    Dupixent  300mg  injected into patient right upper arm. Patient tolerated well.     LOT: [id number]  EXP: 10/09/2024 NDC: 9975-4085-97  Discussed with patient again today that his granddaughter needs to make contact with [facility]  [employer] [facility] so he can start receiving his own medication.    Alan Pizza, RMA

## 2023-10-19 ENCOUNTER — Ambulatory Visit (INDEPENDENT_AMBULATORY_CARE_PROVIDER_SITE_OTHER)

## 2023-10-19 DIAGNOSIS — L2081 Atopic neurodermatitis: Secondary | ICD-10-CM

## 2023-10-19 NOTE — Progress Notes (Signed)
 Patient here today for two week Dupixent  injection for severe Atopic Dermatitis.    Dupixent  300mg  injected into patient left upper arm. Patient tolerated well.     LOT: [id number]  EXP: 06/09/2025 NDC: 9975-4085-97   Discussed with patient again today that his granddaughter needs to make contact with [facility] so he can start receiving his own medication.    Alan Pizza, RMA

## 2023-11-02 ENCOUNTER — Ambulatory Visit (INDEPENDENT_AMBULATORY_CARE_PROVIDER_SITE_OTHER)

## 2023-11-02 DIAGNOSIS — L2081 Atopic neurodermatitis: Secondary | ICD-10-CM

## 2023-11-02 NOTE — Progress Notes (Signed)
 Patient here today for two week Dupixent  injection for severe Atopic Dermatitis.    Dupixent  300mg  injected into patient right upper arm. Patient tolerated well.     LOT: [id number] EXP:06/09/2025 NDC: 9975-4085-97   Alan Pizza, RMA

## 2023-11-03 ENCOUNTER — Other Ambulatory Visit: Payer: Self-pay

## 2023-11-03 DIAGNOSIS — L2081 Atopic neurodermatitis: Secondary | ICD-10-CM

## 2023-11-03 MED ORDER — DUPIXENT 300 MG/2ML ~~LOC~~ SOSY: 300.0000 mg | PREFILLED_SYRINGE | SUBCUTANEOUS | 3 refills | Status: DC

## 2023-11-03 NOTE — Progress Notes (Signed)
 Running benefits for updated PA to see if we can get patient on patient assistance through [facility]  [employer]

## 2023-11-04 ENCOUNTER — Telehealth: Payer: Self-pay

## 2023-11-04 DIAGNOSIS — L2081 Atopic neurodermatitis: Secondary | ICD-10-CM

## 2023-11-04 MED ORDER — DUPIXENT 300 MG/2ML ~~LOC~~ SOSY: 300.0000 mg | PREFILLED_SYRINGE | SUBCUTANEOUS | 3 refills | Status: DC

## 2023-11-04 NOTE — Telephone Encounter (Signed)
 Correcting Dupixent  RX from where I sent 2ml and not 4ml for a full box.

## 2023-11-12 ENCOUNTER — Other Ambulatory Visit

## 2023-11-12 DIAGNOSIS — E7800 Pure hypercholesterolemia, unspecified: Secondary | ICD-10-CM

## 2023-11-13 LAB — LIPID PANEL
Chol/HDL Ratio: 2 ratio (ref 0.0–5.0)
Cholesterol, Total: 175 mg/dL (ref 100–199)
HDL: 87 mg/dL (ref >39)
LDL Chol Calc (NIH): 78 mg/dL (ref 0–99)
Triglycerides: 51 mg/dL (ref 0–149)
VLDL Cholesterol Cal: 10 mg/dL (ref 5–40)

## 2023-11-15 ENCOUNTER — Ambulatory Visit: Payer: Self-pay | Admitting: Internal Medicine

## 2023-11-15 ENCOUNTER — Ambulatory Visit (INDEPENDENT_AMBULATORY_CARE_PROVIDER_SITE_OTHER): Admitting: Internal Medicine

## 2023-11-15 VITALS — BP 102/62 | HR 52 | Temp 96.1°F | Ht 69.0 in | Wt 152.8 lb

## 2023-11-15 DIAGNOSIS — J449 Chronic obstructive pulmonary disease, unspecified: Secondary | ICD-10-CM

## 2023-11-15 DIAGNOSIS — J301 Allergic rhinitis due to pollen: Secondary | ICD-10-CM

## 2023-11-15 DIAGNOSIS — Z23 Encounter for immunization: Secondary | ICD-10-CM

## 2023-11-15 DIAGNOSIS — L2081 Atopic neurodermatitis: Secondary | ICD-10-CM | POA: Diagnosis not present

## 2023-11-15 DIAGNOSIS — I10 Essential (primary) hypertension: Secondary | ICD-10-CM | POA: Diagnosis not present

## 2023-11-15 DIAGNOSIS — E7800 Pure hypercholesterolemia, unspecified: Secondary | ICD-10-CM

## 2023-11-15 NOTE — Progress Notes (Signed)
 Established Patient Office Visit  Subjective:  Patient ID: Larry Parsons, male    DOB: Jul 26, 1940  Age: 83 y.o. MRN: [id number]  Chief Complaint  Patient presents with   Follow-up    3 month lab results    No new complaints, here for lab review and medication refills. LDL and TC well controlled on lab review. Triglycerides also satisfactory. Neurodermatitis is improving.     No other concerns at this time.   Past Medical History:  Diagnosis Date   Arthralgia    Arthritis    Asthma    childhood   COPD (chronic obstructive pulmonary disease) (HCC)    Elevated liver enzymes    Fatty liver    GERD (gastroesophageal reflux disease)    Hepatic fibrosis    Hepatitis C    Hypertension    Positive colorectal cancer screening using Cologuard test    Stroke [facility])    2006    Past Surgical History:  Procedure Laterality Date   COLONOSCOPY N/A 07/30/2021   Procedure: COLONOSCOPY;  Surgeon: Toledo, Ladell POUR, MD;  Location: [facility] ENDOSCOPY;  Service: Gastroenterology;  Laterality: N/A;   COLONOSCOPY WITH PROPOFOL  N/A 03/17/2017   Procedure: COLONOSCOPY WITH PROPOFOL ;  Surgeon: Toledo, Ladell POUR, MD;  Location: [facility] ENDOSCOPY;  Service: Gastroenterology;  Laterality: N/A;   COLONOSCOPY WITH PROPOFOL  N/A 12/01/2018   Procedure: COLONOSCOPY WITH PROPOFOL ;  Surgeon: Toledo, Ladell POUR, MD;  Location: [facility] ENDOSCOPY;  Service: Gastroenterology;  Laterality: N/A;   FLEXIBLE SIGMOIDOSCOPY N/A 04/13/2017   Procedure: FLEXIBLE SIGMOIDOSCOPY;  Surgeon: Toledo, Ladell POUR, MD;  Location: [facility] ENDOSCOPY;  Service: Gastroenterology;  Laterality: N/A;   FLEXIBLE SIGMOIDOSCOPY N/A 10/13/2017   Procedure: FLEXIBLE SIGMOIDOSCOPY;  Surgeon: Toledo, Ladell POUR, MD;  Location: [facility] ENDOSCOPY;  Service: Gastroenterology;  Laterality: N/A;    Social History   Socioeconomic History   Marital status: Married    Spouse name: Not on file   Number of children: Not on file   Years of education: Not on file    Highest education level: Not on file  Occupational History   Not on file  Tobacco Use   Smoking status: Never   Smokeless tobacco: Current    Types: Snuff  Vaping Use   Vaping status: Never Used  Substance and Sexual Activity   Alcohol use: Yes    Alcohol/week: 4.0 standard drinks of alcohol    Types: 4 Shots of liquor per week    Comment: 3x/week (1 beer) occassionally    Drug use: No   Sexual activity: Not on file  Other Topics Concern   Not on file  Social History Narrative   Not on file   Social Drivers of Health   Financial Resource Strain: Not on file  Food Insecurity: Unknown (02/25/2019)   Received from [facility]   Hunger Vital Sign    Within the past 12 months, you worried that your food would run out before you got the money to buy more.: Patient declined    Within the past 12 months, the food you bought just didn't last and you didn't have money to get more.: Patient declined  Transportation Needs: Not on file  Physical Activity: Not on file  Stress: Not on file  Social Connections: Not on file  Intimate Partner Violence: Not on file    Family History  Problem Relation Age of Onset   Heart attack Father    Heart failure Father     Allergies  Allergen Reactions  Ace Inhibitors Swelling    Other reaction(Carsyn Taubman): Angioedema, Other (See Comments), Other (see comments) Other reaction(Yang Rack): Other (See Comments) Angioedema Other reaction(Pearlee Arvizu): Other (See Comments) Angioedema Other reaction(Amadou Katzenstein): Other (See Comments) Angioedema    Lisinopril Other (See Comments) and Swelling    Angioedema   Shellfish Allergy Anaphylaxis   Penicillins Rash    Other reaction(Evalee Gerard): Other (See Comments) hard spot if injected Other reaction(Roc Streett): Other (See Comments) hard spot if injected Other reaction(Athel Merriweather): Other (See Comments) hard spot if injected Other reaction(Keilee Denman): Other (See Comments) hard spot if injected     Outpatient Medications Prior to Visit  Medication Sig    amLODipine  (NORVASC ) 10 MG tablet Take 1 tablet (10 mg total) by mouth daily.   Apoaequorin (PREVAGEN PO) Take 1 capsule by mouth daily. (Patient not taking: Reported on 08/10/2023)   budesonide-formoterol (SYMBICORT) 80-4.5 MCG/ACT inhaler Inhale 2 puffs into the lungs 2 (two) times daily. (Patient taking differently: Inhale 2 puffs into the lungs as needed.)   carvedilol  (COREG ) 25 MG tablet Take 1 tablet (25 mg total) by mouth 2 (two) times daily.   clobetasol  cream (TEMOVATE ) 0.05 % APPLY 1 APPLICATION ONCE DAILY FOR UP TO 5 DAYS A WEEK TO ECZEMA ON BODY. AVOID FACE, GROIN, AXILLA AS DIRECTED (Patient not taking: Reported on 08/10/2023)   colchicine 0.6 MG tablet Take 0.6 mg by mouth daily. Take 2 tablets at first sign of gout flare followed by 1 tablet after 1 hour. Max 1.8mg  within 1 hour (Patient not taking: Reported on 08/10/2023)   Crisaborole  (EUCRISA ) 2 % OINT Apply 1 Application topically daily. For rash at body (atopic dermatitis) (Patient not taking: Reported on 08/10/2023)   dupilumab  (DUPIXENT ) 300 MG/2ML prefilled syringe Inject 300 mg into the skin every 14 (fourteen) days.   EPINEPHrine  0.3 mg/0.3 mL IJ SOAJ injection Inject 0.3 mg into the muscle as needed for anaphylaxis. (Patient not taking: Reported on 08/10/2023)   febuxostat  (ULORIC ) 40 MG tablet Take 1 tablet (40 mg total) by mouth daily.   fexofenadine  (ALLEGRA ) 180 MG tablet Take 1 tablet (180 mg total) by mouth daily.   fluticasone  (FLONASE ) 50 MCG/ACT nasal spray Place 1 spray into both nostrils daily.   montelukast  (SINGULAIR ) 10 MG tablet Take 1 tablet (10 mg total) by mouth daily.   omega-3 acid ethyl esters (LOVAZA) 1 g capsule Take 1 g by mouth daily. (Patient not taking: Reported on 08/10/2023)   omeprazole (PRILOSEC) 20 MG capsule Take 20 mg by mouth daily. (Patient not taking: Reported on 08/10/2023)   tamsulosin (FLOMAX) 0.4 MG CAPS capsule TAKE 1 CAPSULE BY MOUTH EVERY DAY   tiotropium (SPIRIVA) 18 MCG inhalation capsule  Place 18 mcg into inhaler and inhale daily. (Patient not taking: Reported on 08/10/2023)   Facility-Administered Medications Prior to Visit  Medication Dose Route Frequency Provider   dupilumab  (DUPIXENT ) prefilled syringe 300 mg  300 mg Subcutaneous Q14 Days Hester Alm BROCKS, MD    Review of Systems  Constitutional: Negative.   HENT: Negative.    Eyes: Negative.   Respiratory: Negative.    Cardiovascular: Negative.   Gastrointestinal:  Positive for constipation.  Genitourinary: Negative.   Skin:  Positive for rash.  Neurological: Negative.   Endo/Heme/Allergies: Negative.        Objective:   BP 102/62   Pulse (!) 52   Temp (!) 96.1 F (35.6 C)   Ht 5' 9 (1.753 m)   Wt 152 lb 12.8 oz (69.3 kg)   SpO2 92%   BMI  22.56 kg/m   Vitals:   11/15/23 0954  BP: 102/62  Pulse: (!) 52  Temp: (!) 96.1 F (35.6 C)  Height: 5' 9 (1.753 m)  Weight: 152 lb 12.8 oz (69.3 kg)  SpO2: 92%  BMI (Calculated): 22.55    Physical Exam Vitals reviewed.  Constitutional:      Appearance: Normal appearance.  HENT:     Head: Normocephalic.     Left Ear: There is no impacted cerumen.     Nose: Nose normal.     Mouth/Throat:     Mouth: Mucous membranes are moist.     Pharynx: No posterior oropharyngeal erythema.  Eyes:     Extraocular Movements: Extraocular movements intact.     Pupils: Pupils are equal, round, and reactive to light.  Cardiovascular:     Rate and Rhythm: Regular rhythm.     Chest Wall: PMI is not displaced.     Pulses: Normal pulses.     Heart sounds: Normal heart sounds. No murmur heard. Pulmonary:     Effort: Pulmonary effort is normal.     Breath sounds: Normal air entry. Examination of the right-lower field reveals rales. Examination of the left-lower field reveals rales. Rales present. No rhonchi.     Comments: Fine bibasal Abdominal:     General: Abdomen is flat. Bowel sounds are normal. There is no distension.     Palpations: Abdomen is soft. There is no  hepatomegaly, splenomegaly or mass.     Tenderness: There is no abdominal tenderness.  Musculoskeletal:        General: Normal range of motion.     Cervical back: Normal range of motion and neck supple.     Right lower leg: No edema.     Left lower leg: No edema.  Skin:    General: Skin is warm and dry.     Comments: Plaque like lesions on both forearms  Neurological:     General: No focal deficit present.     Mental Status: He is alert and oriented to person, place, and time.     Cranial Nerves: No cranial nerve deficit.     Motor: No weakness.  Psychiatric:        Mood and Affect: Mood normal.        Behavior: Behavior normal.      No results found for any visits on 11/15/23.  Recent Results (from the past 2160 hours)  Lipid panel     Status: None   Collection Time: 11/12/23  9:09 AM  Result Value Ref Range   Cholesterol, Total 175 100 - 199 mg/dL   Triglycerides 51 0 - 149 mg/dL   HDL 87 >60 mg/dL   VLDL Cholesterol Cal 10 5 - 40 mg/dL   LDL Chol Calc (NIH) 78 0 - 99 mg/dL   Chol/HDL Ratio 2.0 0.0 - 5.0 ratio    Comment:                                   T. Chol/HDL Ratio                                             Men  Women  1/2 Avg.Risk  3.4    3.3                                   Avg.Risk  5.0    4.4                                2X Avg.Risk  9.6    7.1                                3X Avg.Risk 23.4   11.0       Assessment & Plan:  Lemar was seen today for follow-up.  Pure hypercholesterolemia -     Lipid panel  Primary hypertension -     CBC With Diff/Platelet -     Comprehensive metabolic panel with GFR -     Flu Vaccine Trivalent High Dose (Fluad)  Chronic obstructive pulmonary disease, unspecified COPD type (HCC)  Atopic neurodermatitis  Non-seasonal allergic rhinitis due to pollen    Problem List Items Addressed This Visit       Cardiovascular and Mediastinum   Primary hypertension   Relevant Orders   CBC  With Diff/Platelet   Comprehensive metabolic panel with GFR   Flu Vaccine Trivalent High Dose (Fluad)     Respiratory   Chronic obstructive pulmonary disease (HCC)     Musculoskeletal and Integument   Atopic neurodermatitis     Other   Pure hypercholesterolemia - Primary   Other Visit Diagnoses       Non-seasonal allergic rhinitis due to pollen           Return in about 6 weeks (around 12/27/2023) for awv with labs prior.   Total time spent: 20 minutes  Sherrill Cinderella Perry, MD  11/15/2023   This document may have been prepared by [facility] Voice Recognition software and as such may include unintentional dictation errors.

## 2023-11-16 ENCOUNTER — Ambulatory Visit (INDEPENDENT_AMBULATORY_CARE_PROVIDER_SITE_OTHER)

## 2023-11-16 DIAGNOSIS — L2081 Atopic neurodermatitis: Secondary | ICD-10-CM

## 2023-11-16 NOTE — Progress Notes (Signed)
 Patient here today for two week Dupixent  injection for severe Atopic Dermatitis.    Dupixent  300mg  injected into patient left upper arm. Patient tolerated well.     LOT: [id number]  EXP: 01/08/2026 NDC: 9975-4084-97   Alan Pizza, RMA

## 2023-11-18 ENCOUNTER — Other Ambulatory Visit: Payer: Self-pay

## 2023-11-18 DIAGNOSIS — L2081 Atopic neurodermatitis: Secondary | ICD-10-CM

## 2023-11-18 MED ORDER — DUPIXENT 300 MG/2ML ~~LOC~~ SOAJ: 300.0000 mg | SUBCUTANEOUS | 1 refills | Status: AC

## 2023-11-22 ENCOUNTER — Telehealth: Payer: Self-pay

## 2023-11-22 NOTE — Telephone Encounter (Signed)
 Received fax of scheduled delivery for Dupixent  on 11/26/23, but our office is closed that day. Rescheduled delivery to 11/25/23.

## 2023-11-30 ENCOUNTER — Ambulatory Visit

## 2023-11-30 DIAGNOSIS — L2081 Atopic neurodermatitis: Secondary | ICD-10-CM

## 2023-11-30 NOTE — Progress Notes (Signed)
 Patient here today for two week Dupixent  injection for severe Atopic Dermatitis.    Dupixent  300mg  injected into patient right upper arm. Patient tolerated well.     LOT: [id number]  EXP: 09/09/2025 NDC: 9975-4084-97   Alan Pizza, RMA

## 2023-12-15 ENCOUNTER — Ambulatory Visit

## 2023-12-15 DIAGNOSIS — L2081 Atopic neurodermatitis: Secondary | ICD-10-CM

## 2023-12-15 NOTE — Progress Notes (Signed)
 Patient here today for two week Dupixent  injection for severe Atopic Dermatitis.    Dupixent  300mg  injected into patient left upper arm. Patient tolerated well.     LOT: [id number]  EXP: 09/09/2025 NDC: 9975-4084-97   Alan Pizza, RMA

## 2023-12-28 ENCOUNTER — Encounter: Payer: Self-pay | Admitting: Internal Medicine

## 2023-12-28 ENCOUNTER — Ambulatory Visit (INDEPENDENT_AMBULATORY_CARE_PROVIDER_SITE_OTHER): Admitting: Internal Medicine

## 2023-12-28 DIAGNOSIS — M1A9XX0 Chronic gout, unspecified, without tophus (tophi): Secondary | ICD-10-CM

## 2023-12-28 DIAGNOSIS — J301 Allergic rhinitis due to pollen: Secondary | ICD-10-CM

## 2023-12-28 DIAGNOSIS — Z0001 Encounter for general adult medical examination with abnormal findings: Secondary | ICD-10-CM | POA: Diagnosis not present

## 2023-12-28 DIAGNOSIS — Z1389 Encounter for screening for other disorder: Secondary | ICD-10-CM

## 2023-12-28 DIAGNOSIS — Z1331 Encounter for screening for depression: Secondary | ICD-10-CM | POA: Diagnosis not present

## 2023-12-28 DIAGNOSIS — N400 Enlarged prostate without lower urinary tract symptoms: Secondary | ICD-10-CM | POA: Diagnosis not present

## 2023-12-28 DIAGNOSIS — Z739 Problem related to life management difficulty, unspecified: Secondary | ICD-10-CM

## 2023-12-28 DIAGNOSIS — E7800 Pure hypercholesterolemia, unspecified: Secondary | ICD-10-CM

## 2023-12-28 DIAGNOSIS — Z0130 Encounter for examination of blood pressure without abnormal findings: Secondary | ICD-10-CM

## 2023-12-28 MED ORDER — TAMSULOSIN HCL 0.4 MG PO CAPS: 0.4000 mg | ORAL_CAPSULE | Freq: Every day | ORAL | 1 refills | Status: AC

## 2023-12-28 MED ORDER — FEXOFENADINE HCL 180 MG PO TABS: 180.0000 mg | ORAL_TABLET | Freq: Every day | ORAL | 1 refills | Status: DC

## 2023-12-28 MED ORDER — FLUTICASONE PROPIONATE 50 MCG/ACT NA SUSP
1.0000 | Freq: Every day | NASAL | 2 refills | Status: AC
Start: 2023-12-28 — End: ?

## 2023-12-28 MED ORDER — FEBUXOSTAT 40 MG PO TABS: 40.0000 mg | ORAL_TABLET | Freq: Every day | ORAL | 0 refills | Status: AC

## 2023-12-28 NOTE — Progress Notes (Signed)
 Established Patient Office Visit  Subjective:  Patient ID: Larry Parsons, male    DOB: February 28, 1940  Age: 83 y.o. MRN: [id number]  Chief Complaint  Patient presents with   Annual Exam    AWV    No new complaints, here for AWV refer to quality metrics and scanned documents.      No other concerns at this time.   Past Medical History:  Diagnosis Date   Arthralgia    Arthritis    Asthma    childhood   COPD (chronic obstructive pulmonary disease) (HCC)    Elevated liver enzymes    Fatty liver    GERD (gastroesophageal reflux disease)    Hepatic fibrosis    Hepatitis C    Hypertension    Positive colorectal cancer screening using Cologuard test    Stroke [facility])    2006    Past Surgical History:  Procedure Laterality Date   COLONOSCOPY N/A 07/30/2021   Procedure: COLONOSCOPY;  Surgeon: Toledo, Ladell POUR, MD;  Location: [facility] ENDOSCOPY;  Service: Gastroenterology;  Laterality: N/A;   COLONOSCOPY WITH PROPOFOL  N/A 03/17/2017   Procedure: COLONOSCOPY WITH PROPOFOL ;  Surgeon: Toledo, Ladell POUR, MD;  Location: [facility] ENDOSCOPY;  Service: Gastroenterology;  Laterality: N/A;   COLONOSCOPY WITH PROPOFOL  N/A 12/01/2018   Procedure: COLONOSCOPY WITH PROPOFOL ;  Surgeon: Toledo, Ladell POUR, MD;  Location: [facility] ENDOSCOPY;  Service: Gastroenterology;  Laterality: N/A;   FLEXIBLE SIGMOIDOSCOPY N/A 04/13/2017   Procedure: FLEXIBLE SIGMOIDOSCOPY;  Surgeon: Toledo, Ladell POUR, MD;  Location: [facility] ENDOSCOPY;  Service: Gastroenterology;  Laterality: N/A;   FLEXIBLE SIGMOIDOSCOPY N/A 10/13/2017   Procedure: FLEXIBLE SIGMOIDOSCOPY;  Surgeon: Toledo, Ladell POUR, MD;  Location: [facility] ENDOSCOPY;  Service: Gastroenterology;  Laterality: N/A;    Social History   Socioeconomic History   Marital status: Married    Spouse name: Not on file   Number of children: Not on file   Years of education: Not on file   Highest education level: Not on file  Occupational History   Not on file  Tobacco Use   Smoking  status: Never   Smokeless tobacco: Current    Types: Snuff  Vaping Use   Vaping status: Never Used  Substance and Sexual Activity   Alcohol use: Yes    Alcohol/week: 4.0 standard drinks of alcohol    Types: 4 Shots of liquor per week    Comment: 3x/week (1 beer) occassionally    Drug use: No   Sexual activity: Not on file  Other Topics Concern   Not on file  Social History Narrative   Not on file   Social Drivers of Health   Financial Resource Strain: Not on file  Food Insecurity: Unknown (02/25/2019)   Received from [facility]   Hunger Vital Sign    Within the past 12 months, you worried that your food would run out before you got the money to buy more.: Patient declined    Within the past 12 months, the food you bought just didn't last and you didn't have money to get more.: Patient declined  Transportation Needs: Not on file  Physical Activity: Not on file  Stress: Not on file  Social Connections: Not on file  Intimate Partner Violence: Not on file    Family History  Problem Relation Age of Onset   Heart attack Father    Heart failure Father     Allergies  Allergen Reactions   Ace Inhibitors Swelling    Other reaction(Makensie Mulhall): Angioedema, Other (  See Comments), Other (see comments) Other reaction(Joylynn Defrancesco): Other (See Comments) Angioedema Other reaction(Pablo Mathurin): Other (See Comments) Angioedema Other reaction(Teresita Fanton): Other (See Comments) Angioedema    Lisinopril Other (See Comments) and Swelling    Angioedema   Shellfish Allergy Anaphylaxis   Penicillins Rash    Other reaction(Sicilia Killough): Other (See Comments) hard spot if injected Other reaction(Memori Sammon): Other (See Comments) hard spot if injected Other reaction(Dashonda Bonneau): Other (See Comments) hard spot if injected Other reaction(Perry Brucato): Other (See Comments) hard spot if injected     Outpatient Medications Prior to Visit  Medication Sig   amLODipine  (NORVASC ) 10 MG tablet Take 1 tablet (10 mg total) by mouth daily.   carvedilol   (COREG ) 25 MG tablet Take 1 tablet (25 mg total) by mouth 2 (two) times daily.   Dupilumab  (DUPIXENT ) 300 MG/2ML SOAJ Inject 300 mg into the skin every 14 (fourteen) days. Starting at day 15 for maintenance.   montelukast  (SINGULAIR ) 10 MG tablet Take 1 tablet (10 mg total) by mouth daily.   [DISCONTINUED] febuxostat  (ULORIC ) 40 MG tablet Take 1 tablet (40 mg total) by mouth daily.   [DISCONTINUED] fexofenadine  (ALLEGRA ) 180 MG tablet Take 1 tablet (180 mg total) by mouth daily.   [DISCONTINUED] fluticasone  (FLONASE ) 50 MCG/ACT nasal spray Place 1 spray into both nostrils daily.   [DISCONTINUED] tamsulosin (FLOMAX) 0.4 MG CAPS capsule TAKE 1 CAPSULE BY MOUTH EVERY DAY   Apoaequorin (PREVAGEN PO) Take 1 capsule by mouth daily. (Patient not taking: Reported on 12/28/2023)   budesonide-formoterol (SYMBICORT) 80-4.5 MCG/ACT inhaler Inhale 2 puffs into the lungs 2 (two) times daily. (Patient not taking: Reported on 12/28/2023)   clobetasol  cream (TEMOVATE ) 0.05 % APPLY 1 APPLICATION ONCE DAILY FOR UP TO 5 DAYS A WEEK TO ECZEMA ON BODY. AVOID FACE, GROIN, AXILLA AS DIRECTED (Patient not taking: Reported on 12/28/2023)   colchicine 0.6 MG tablet Take 0.6 mg by mouth daily. Take 2 tablets at first sign of gout flare followed by 1 tablet after 1 hour. Max 1.8mg  within 1 hour (Patient not taking: Reported on 12/28/2023)   Crisaborole  (EUCRISA ) 2 % OINT Apply 1 Application topically daily. For rash at body (atopic dermatitis) (Patient not taking: Reported on 12/28/2023)   EPINEPHrine  0.3 mg/0.3 mL IJ SOAJ injection Inject 0.3 mg into the muscle as needed for anaphylaxis. (Patient not taking: Reported on 12/28/2023)   omega-3 acid ethyl esters (LOVAZA) 1 g capsule Take 1 g by mouth daily. (Patient not taking: Reported on 12/28/2023)   omeprazole (PRILOSEC) 20 MG capsule Take 20 mg by mouth daily. (Patient not taking: Reported on 12/28/2023)   tiotropium (SPIRIVA) 18 MCG inhalation capsule Place 18 mcg into  inhaler and inhale daily. (Patient not taking: Reported on 12/28/2023)   Facility-Administered Medications Prior to Visit  Medication Dose Route Frequency Provider   dupilumab  (DUPIXENT ) prefilled syringe 300 mg  300 mg Subcutaneous Q14 Days Hester Alm BROCKS, MD    Review of Systems  Constitutional: Negative.  Negative for weight loss (gained 5 lbs).  HENT: Negative.    Eyes: Negative.   Respiratory: Negative.    Cardiovascular: Negative.   Gastrointestinal:  Positive for constipation.  Genitourinary: Negative.   Skin:  Positive for rash.  Neurological: Negative.   Endo/Heme/Allergies: Negative.        Objective:   BP 112/77   Pulse (!) 54   Temp (!) 97 F (36.1 C)   Ht 5' 9 (1.753 m)   Wt 157 lb 3.2 oz (71.3 kg)   SpO2 96%  BMI 23.21 kg/m   Vitals:   12/28/23 1100  BP: 112/77  Pulse: (!) 54  Temp: (!) 97 F (36.1 C)  Height: 5' 9 (1.753 m)  Weight: 157 lb 3.2 oz (71.3 kg)  SpO2: 96%  BMI (Calculated): 23.2    Physical Exam Vitals reviewed.  Constitutional:      Appearance: Normal appearance.  HENT:     Head: Normocephalic.     Left Ear: There is no impacted cerumen.     Nose: Nose normal.     Mouth/Throat:     Mouth: Mucous membranes are moist.     Pharynx: No posterior oropharyngeal erythema.  Eyes:     Extraocular Movements: Extraocular movements intact.     Pupils: Pupils are equal, round, and reactive to light.  Cardiovascular:     Rate and Rhythm: Regular rhythm.     Chest Wall: PMI is not displaced.     Pulses: Normal pulses.     Heart sounds: Normal heart sounds. No murmur heard. Pulmonary:     Effort: Pulmonary effort is normal.     Breath sounds: Normal air entry. Examination of the right-lower field reveals rales. Examination of the left-lower field reveals rales. Rales present. No rhonchi.     Comments: Fine bibasal Abdominal:     General: Abdomen is flat. Bowel sounds are normal. There is no distension.     Palpations: Abdomen is  soft. There is no hepatomegaly, splenomegaly or mass.     Tenderness: There is no abdominal tenderness.  Musculoskeletal:        General: Normal range of motion.     Cervical back: Normal range of motion and neck supple.     Right lower leg: No edema.     Left lower leg: No edema.  Skin:    General: Skin is warm and dry.     Comments: Plaque like lesions on both forearms  Neurological:     General: No focal deficit present.     Mental Status: He is alert and oriented to person, place, and time.     Cranial Nerves: No cranial nerve deficit.     Motor: No weakness.  Psychiatric:        Mood and Affect: Mood normal.        Behavior: Behavior normal.      No results found for any visits on 12/28/23.  Recent Results (from the past 2160 hours)  Lipid panel     Status: None   Collection Time: 11/12/23  9:09 AM  Result Value Ref Range   Cholesterol, Total 175 100 - 199 mg/dL   Triglycerides 51 0 - 149 mg/dL   HDL 87 >60 mg/dL   VLDL Cholesterol Cal 10 5 - 40 mg/dL   LDL Chol Calc (NIH) 78 0 - 99 mg/dL   Chol/HDL Ratio 2.0 0.0 - 5.0 ratio    Comment:                                   T. Chol/HDL Ratio                                             Men  Women  1/2 Avg.Risk  3.4    3.3                                   Avg.Risk  5.0    4.4                                2X Avg.Risk  9.6    7.1                                3X Avg.Risk 23.4   11.0       Assessment & Plan:  Naziah was seen today for annual exam.  Chronic gout without tophus, unspecified cause, unspecified site -     Febuxostat ; Take 1 tablet (40 mg total) by mouth daily.  Dispense: 90 tablet; Refill: 0  Non-seasonal allergic rhinitis due to pollen -     Fexofenadine  HCl; Take 1 tablet (180 mg total) by mouth daily.  Dispense: 30 tablet; Refill: 1 -     Fluticasone  Propionate; Place 1 spray into both nostrils daily.  Dispense: 16 g; Refill: 2  Benign prostatic hyperplasia without lower  urinary tract symptoms -     Tamsulosin HCl; Take 1 capsule (0.4 mg total) by mouth daily.  Dispense: 90 capsule; Refill: 1  Pure hypercholesterolemia -     Lipid panel -     Comprehensive metabolic panel with GFR    Problem List Items Addressed This Visit       Other   Chronic gout without tophus   Relevant Medications   febuxostat  (ULORIC ) 40 MG tablet   Pure hypercholesterolemia   Relevant Orders   Comprehensive metabolic panel with GFR   Other Visit Diagnoses       Non-seasonal allergic rhinitis due to pollen       Relevant Medications   fexofenadine  (ALLEGRA ) 180 MG tablet   fluticasone  (FLONASE ) 50 MCG/ACT nasal spray     Benign prostatic hyperplasia without lower urinary tract symptoms       Relevant Medications   tamsulosin (FLOMAX) 0.4 MG CAPS capsule       Return in 7 weeks (on 02/15/2024).   Total time spent: 20 minutes. This time includes review of previous notes and results and patient face to face interaction during today'Tr[facility]un visit.    Sherrill Cinderella Perry, MD  12/28/2023   This document may have been prepared by [facility] Voice Recognition software and as such may include unintentional dictation errors.

## 2023-12-29 ENCOUNTER — Ambulatory Visit

## 2023-12-29 DIAGNOSIS — L2081 Atopic neurodermatitis: Secondary | ICD-10-CM

## 2023-12-29 NOTE — Progress Notes (Signed)
 Patient here today for two week Dupixent  injection for severe Atopic Dermatitis.    Dupixent  300mg  injected into patient right upper arm. Patient tolerated well.     LOT: [id number]  EXP: 02/08/2026 NDC: 9975-4084-97   Alan Pizza, RMA

## 2024-01-11 ENCOUNTER — Telehealth: Payer: Self-pay | Admitting: Internal Medicine

## 2024-01-11 ENCOUNTER — Other Ambulatory Visit: Payer: Self-pay | Admitting: Internal Medicine

## 2024-01-11 ENCOUNTER — Other Ambulatory Visit

## 2024-01-11 DIAGNOSIS — I10 Essential (primary) hypertension: Secondary | ICD-10-CM

## 2024-01-11 MED ORDER — CARVEDILOL 25 MG PO TABS: 25.0000 mg | ORAL_TABLET | Freq: Two times a day (BID) | ORAL | 1 refills | Status: DC

## 2024-01-12 ENCOUNTER — Ambulatory Visit

## 2024-01-12 DIAGNOSIS — L2081 Atopic neurodermatitis: Secondary | ICD-10-CM

## 2024-01-12 LAB — COMPREHENSIVE METABOLIC PANEL WITH GFR
ALT: 7 IU/L (ref 0–44)
AST: 13 IU/L (ref 0–40)
Albumin: 4.3 g/dL (ref 3.7–4.7)
Alkaline Phosphatase: 51 IU/L (ref 48–129)
BUN/Creatinine Ratio: 10 (ref 10–24)
BUN: 10 mg/dL (ref 8–27)
Bilirubin Total: 0.5 mg/dL (ref 0.0–1.2)
CO2: 24 mmol/L (ref 20–29)
Calcium: 9.8 mg/dL (ref 8.6–10.2)
Chloride: 102 mmol/L (ref 96–106)
Creatinine, Ser: 1.03 mg/dL (ref 0.76–1.27)
Globulin, Total: 2.9 g/dL (ref 1.5–4.5)
Glucose: 96 mg/dL (ref 70–99)
Potassium: 4.1 mmol/L (ref 3.5–5.2)
Sodium: 143 mmol/L (ref 134–144)
Total Protein: 7.2 g/dL (ref 6.0–8.5)
eGFR: 72 mL/min/1.73 (ref >59)

## 2024-01-12 LAB — LIPID PANEL
Chol/HDL Ratio: 2.2 ratio (ref 0.0–5.0)
Cholesterol, Total: 198 mg/dL (ref 100–199)
HDL: 90 mg/dL (ref >39)
LDL Chol Calc (NIH): 96 mg/dL (ref 0–99)
Triglycerides: 67 mg/dL (ref 0–149)
VLDL Cholesterol Cal: 12 mg/dL (ref 5–40)

## 2024-01-12 NOTE — Progress Notes (Signed)
 Patient here today for two week Dupixent  injection for severe Atopic Dermatitis.    Dupixent  300mg  injected into patient left upper arm. Patient tolerated well.     LOT: [id number]  EXP: 08/08/2025 NDC: 9975-4084-97   Alan Pizza, RMA

## 2024-01-12 NOTE — Telephone Encounter (Signed)
 Medication was refilled on 01/11/24.

## 2024-01-26 ENCOUNTER — Ambulatory Visit

## 2024-01-26 DIAGNOSIS — L2081 Atopic neurodermatitis: Secondary | ICD-10-CM | POA: Diagnosis not present

## 2024-01-26 NOTE — Progress Notes (Signed)
 Patient here today for two week Dupixent  injection for severe Atopic Dermatitis.    Dupixent  300mg  injected into patient right upper arm. Patient tolerated well.     LOT: [id number]  EXP: 07/10/2025 NDC: 9975-4084-97   Alan Pizza, RMA

## 2024-02-02 ENCOUNTER — Other Ambulatory Visit: Payer: Self-pay

## 2024-02-02 DIAGNOSIS — I10 Essential (primary) hypertension: Secondary | ICD-10-CM

## 2024-02-02 MED ORDER — CARVEDILOL 25 MG PO TABS: 25.0000 mg | ORAL_TABLET | Freq: Two times a day (BID) | ORAL | 1 refills | Status: AC

## 2024-02-08 ENCOUNTER — Ambulatory Visit (INDEPENDENT_AMBULATORY_CARE_PROVIDER_SITE_OTHER)

## 2024-02-08 DIAGNOSIS — L2081 Atopic neurodermatitis: Secondary | ICD-10-CM | POA: Diagnosis not present

## 2024-02-08 MED ORDER — DUPILUMAB 300 MG/2ML ~~LOC~~ SOAJ
300.0000 mg | Freq: Once | SUBCUTANEOUS | Status: AC
  Administered 2024-02-08: 300 mg via SUBCUTANEOUS

## 2024-02-08 NOTE — Progress Notes (Addendum)
 Patient here today for two week Dupixent  injection for severe Atopic Dermatitis.    Dupixent  300mg  injected into patient left upper arm. Patient tolerated well.     LOT: [id number]  EXP: 07/10/2025 NDC: 9975-4084-97   Alan Pizza, RMA

## 2024-02-14 ENCOUNTER — Ambulatory Visit: Admitting: Internal Medicine

## 2024-02-14 ENCOUNTER — Ambulatory Visit: Payer: Self-pay | Admitting: Internal Medicine

## 2024-02-14 DIAGNOSIS — E7800 Pure hypercholesterolemia, unspecified: Secondary | ICD-10-CM

## 2024-02-14 DIAGNOSIS — I10 Essential (primary) hypertension: Secondary | ICD-10-CM

## 2024-02-14 DIAGNOSIS — J301 Allergic rhinitis due to pollen: Secondary | ICD-10-CM

## 2024-02-14 MED ORDER — FEXOFENADINE HCL 180 MG PO TABS: 180.0000 mg | ORAL_TABLET | Freq: Every day | ORAL | 1 refills | Status: AC

## 2024-02-14 MED ORDER — AMLODIPINE BESYLATE 10 MG PO TABS: 10.0000 mg | ORAL_TABLET | Freq: Every day | ORAL | 1 refills | Status: AC

## 2024-02-14 NOTE — Progress Notes (Signed)
 "  Established Patient Office Visit  Subjective:  Patient ID: Larry Parsons, male    DOB: [birth year]  Age: 84 y.o. MRN: [id number]  Chief Complaint  Patient presents with   Follow-up    7 week follow up. Pt. Having some noticeable memory loss/fog.    No new complaints, here for lab review and medication refills. Labs reviewed and notable for well controlled lipids and unremarkable cmp.    No other concerns at this time.   Past Medical History:  Diagnosis Date   Arthralgia    Arthritis    Asthma    childhood   COPD (chronic obstructive pulmonary disease) (HCC)    Elevated liver enzymes    Fatty liver    GERD (gastroesophageal reflux disease)    Hepatic fibrosis    Hepatitis C    Hypertension    Positive colorectal cancer screening using Cologuard test    Stroke [facility])    2006    Past Surgical History:  Procedure Laterality Date   COLONOSCOPY N/A 07/30/2021   Procedure: COLONOSCOPY;  Surgeon: Toledo, Ladell POUR, MD;  Location: [facility] ENDOSCOPY;  Service: Gastroenterology;  Laterality: N/A;   COLONOSCOPY WITH PROPOFOL  N/A 03/17/2017   Procedure: COLONOSCOPY WITH PROPOFOL ;  Surgeon: Toledo, Ladell POUR, MD;  Location: [facility] ENDOSCOPY;  Service: Gastroenterology;  Laterality: N/A;   COLONOSCOPY WITH PROPOFOL  N/A 12/01/2018   Procedure: COLONOSCOPY WITH PROPOFOL ;  Surgeon: Toledo, Ladell POUR, MD;  Location: [facility] ENDOSCOPY;  Service: Gastroenterology;  Laterality: N/A;   FLEXIBLE SIGMOIDOSCOPY N/A 04/13/2017   Procedure: FLEXIBLE SIGMOIDOSCOPY;  Surgeon: Toledo, Ladell POUR, MD;  Location: [facility] ENDOSCOPY;  Service: Gastroenterology;  Laterality: N/A;   FLEXIBLE SIGMOIDOSCOPY N/A 10/13/2017   Procedure: FLEXIBLE SIGMOIDOSCOPY;  Surgeon: Toledo, Ladell POUR, MD;  Location: [facility] ENDOSCOPY;  Service: Gastroenterology;  Laterality: N/A;    Social History   Socioeconomic History   Marital status: Married    Spouse name: Not on file   Number of children: Not on file   Years of education: Not on  file   Highest education level: Not on file  Occupational History   Not on file  Tobacco Use   Smoking status: Never   Smokeless tobacco: Current    Types: Snuff  Vaping Use   Vaping status: Never Used  Substance and Sexual Activity   Alcohol use: Yes    Alcohol/week: 4.0 standard drinks of alcohol    Types: 4 Shots of liquor per week    Comment: 3x/week (1 beer) occassionally    Drug use: No   Sexual activity: Not on file  Other Topics Concern   Not on file  Social History Narrative   Not on file   Social Drivers of Health   Tobacco Use: High Risk (12/28/2023)   Patient History    Smoking Tobacco Use: Never    Smokeless Tobacco Use: Current    Passive Exposure: Not on file  Financial Resource Strain: Not on file  Food Insecurity: Not on file  Transportation Needs: Not on file  Physical Activity: Not on file  Stress: Not on file  Social Connections: Not on file  Intimate Partner Violence: Not on file  Depression (PHQ2-9): Low Risk (12/28/2023)   Depression (PHQ2-9)    PHQ-2 Score: 0  Alcohol Screen: Not on file  Housing: Not on file  Utilities: Not on file  Health Literacy: Not on file    Family History  Problem Relation Age of Onset   Heart attack Father  Heart failure Father     Allergies[1]  Show/hide medication list[2]  ROS     Objective:   BP 106/60   Pulse (!) 53   Temp 97.9 F (36.6 C)   Ht 5' 9 (1.753 m)   Wt 156 lb 12.8 oz (71.1 kg)   SpO2 92%   BMI 23.16 kg/m   Vitals:   02/14/24 1059  BP: 106/60  Pulse: (!) 53  Temp: 97.9 F (36.6 C)  Height: 5' 9 (1.753 m)  Weight: 156 lb 12.8 oz (71.1 kg)  SpO2: 92%  BMI (Calculated): 23.14    Physical Exam   No results found for any visits on 02/14/24.  Recent Results (from the past 2160 hours)  Lipid panel     Status: None   Collection Time: 01/11/24  9:29 AM  Result Value Ref Range   Cholesterol, Total 198 100 - 199 mg/dL   Triglycerides 67 0 - 149 mg/dL   HDL 90 >60  mg/dL   VLDL Cholesterol Cal 12 5 - 40 mg/dL   LDL Chol Calc (NIH) 96 0 - 99 mg/dL   Chol/HDL Ratio 2.2 0.0 - 5.0 ratio    Comment:                                   T. Chol/HDL Ratio                                             Men  Women                               1/2 Avg.Risk  3.4    3.3                                   Avg.Risk  5.0    4.4                                2X Avg.Risk  9.6    7.1                                3X Avg.Risk 23.4   11.0   Comprehensive metabolic panel with GFR     Status: None   Collection Time: 01/11/24  9:30 AM  Result Value Ref Range   Glucose 96 70 - 99 mg/dL   BUN 10 8 - 27 mg/dL   Creatinine, Ser 8.96 0.76 - 1.27 mg/dL   eGFR 72 >40 fO/fpw/8.26   BUN/Creatinine Ratio 10 10 - 24   Sodium 143 134 - 144 mmol/L   Potassium 4.1 3.5 - 5.2 mmol/L   Chloride 102 96 - 106 mmol/L   CO2 24 20 - 29 mmol/L   Calcium 9.8 8.6 - 10.2 mg/dL   Total Protein 7.2 6.0 - 8.5 g/dL   Albumin 4.3 3.7 - 4.7 g/dL   Globulin, Total 2.9 1.5 - 4.5 g/dL   Bilirubin Total 0.5 0.0 - 1.2 mg/dL   Alkaline Phosphatase 51 48 - 129 IU/L   AST 13 0 - 40 IU/L  ALT 7 0 - 44 IU/L      Assessment & Plan:  Larry Parsons was seen today for follow-up.  Non-seasonal allergic rhinitis due to pollen -     Fexofenadine  HCl; Take 1 tablet (180 mg total) by mouth daily.  Dispense: 30 tablet; Refill: 1  Primary hypertension -     amLODIPine  Besylate; Take 1 tablet (10 mg total) by mouth daily.  Dispense: 90 tablet; Refill: 1  Pure hypercholesterolemia -     Lipid panel    Problem List Items Addressed This Visit       Cardiovascular and Mediastinum   Primary hypertension   Relevant Medications   amLODipine  (NORVASC ) 10 MG tablet     Other   Pure hypercholesterolemia   Relevant Medications   amLODipine  (NORVASC ) 10 MG tablet   Other Visit Diagnoses       Non-seasonal allergic rhinitis due to pollen       Relevant Medications   fexofenadine  (ALLEGRA ) 180 MG tablet        Return in about 3 months (around 05/14/2024) for fu with labs prior.   Total time spent: 20 minutes. This time includes review of previous notes and results and patient face to face interaction during today'Larry Parsons visit.    Larry Cinderella Perry, MD  02/14/2024   This document may have been prepared by [facility] Voice Recognition software and as such may include unintentional dictation errors.      [1]  Allergies Allergen Reactions   Ace Inhibitors Swelling    Other reaction(Kwinton Maahs): Angioedema, Other (See Comments), Other (see comments) Other reaction(Cerena Baine): Other (See Comments) Angioedema Other reaction(Reyne Falconi): Other (See Comments) Angioedema Other reaction(Maninder Deboer): Other (See Comments) Angioedema    Lisinopril Other (See Comments) and Swelling    Angioedema   Shellfish Allergy Anaphylaxis   Penicillins Rash    Other reaction(Layann Bluett): Other (See Comments) hard spot if injected Other reaction(Ilir Mahrt): Other (See Comments) hard spot if injected Other reaction(Joniyah Mallinger): Other (See Comments) hard spot if injected Other reaction(Shauntea Lok): Other (See Comments) hard spot if injected   [2]  Outpatient Medications Prior to Visit  Medication Sig   carvedilol  (COREG ) 25 MG tablet Take 1 tablet (25 mg total) by mouth 2 (two) times daily.   febuxostat  (ULORIC ) 40 MG tablet Take 1 tablet (40 mg total) by mouth daily.   fluticasone  (FLONASE ) 50 MCG/ACT nasal spray Place 1 spray into both nostrils daily.   tamsulosin  (FLOMAX ) 0.4 MG CAPS capsule Take 1 capsule (0.4 mg total) by mouth daily.   [DISCONTINUED] amLODipine  (NORVASC ) 10 MG tablet Take 1 tablet (10 mg total) by mouth daily.   [DISCONTINUED] fexofenadine  (ALLEGRA ) 180 MG tablet Take 1 tablet (180 mg total) by mouth daily.   Apoaequorin (PREVAGEN PO) Take 1 capsule by mouth daily. (Patient not taking: Reported on 02/14/2024)   budesonide-formoterol (SYMBICORT) 80-4.5 MCG/ACT inhaler Inhale 2 puffs into the lungs 2 (two) times daily. (Patient not taking:  Reported on 02/14/2024)   clobetasol  cream (TEMOVATE ) 0.05 % APPLY 1 APPLICATION ONCE DAILY FOR UP TO 5 DAYS A WEEK TO ECZEMA ON BODY. AVOID FACE, GROIN, AXILLA AS DIRECTED (Patient not taking: Reported on 02/14/2024)   colchicine 0.6 MG tablet Take 0.6 mg by mouth daily. Take 2 tablets at first sign of gout flare followed by 1 tablet after 1 hour. Max 1.8mg  within 1 hour (Patient not taking: Reported on 02/14/2024)   Crisaborole  (EUCRISA ) 2 % OINT Apply 1 Application topically daily. For rash at body (atopic dermatitis) (Patient not taking: Reported  on 02/14/2024)   Dupilumab  (DUPIXENT ) 300 MG/2ML SOAJ Inject 300 mg into the skin every 14 (fourteen) days. Starting at day 15 for maintenance. (Patient not taking: Reported on 02/14/2024)   EPINEPHrine  0.3 mg/0.3 mL IJ SOAJ injection Inject 0.3 mg into the muscle as needed for anaphylaxis. (Patient not taking: Reported on 02/14/2024)   montelukast  (SINGULAIR ) 10 MG tablet Take 1 tablet (10 mg total) by mouth daily. (Patient not taking: Reported on 02/14/2024)   omega-3 acid ethyl esters (LOVAZA) 1 g capsule Take 1 g by mouth daily. (Patient not taking: Reported on 02/14/2024)   omeprazole (PRILOSEC) 20 MG capsule Take 20 mg by mouth daily. (Patient not taking: Reported on 02/14/2024)   tiotropium (SPIRIVA) 18 MCG inhalation capsule Place 18 mcg into inhaler and inhale daily. (Patient not taking: Reported on 02/14/2024)   No facility-administered medications prior to visit.   "

## 2024-02-23 ENCOUNTER — Ambulatory Visit (INDEPENDENT_AMBULATORY_CARE_PROVIDER_SITE_OTHER)

## 2024-02-23 DIAGNOSIS — L209 Atopic dermatitis, unspecified: Secondary | ICD-10-CM

## 2024-02-23 MED ORDER — DUPILUMAB 300 MG/2ML ~~LOC~~ SOSY
300.0000 mg | PREFILLED_SYRINGE | Freq: Once | SUBCUTANEOUS | Status: AC
  Administered 2024-02-23: 300 mg via SUBCUTANEOUS

## 2024-02-23 NOTE — Progress Notes (Signed)
 Patient here today for two week Dupixent  injection for severe Atopic Dermatitis.    Dupixent  300mg  injected into patient right upper arm. Patient tolerated well.     LOT: [id number]  EXP: 07/10/2025 NDC: 9975-4084-97   Stefano Saba, RMA

## 2024-02-29 ENCOUNTER — Ambulatory Visit: Admitting: Dermatology

## 2024-03-08 ENCOUNTER — Ambulatory Visit

## 2024-03-08 DIAGNOSIS — L209 Atopic dermatitis, unspecified: Secondary | ICD-10-CM | POA: Diagnosis not present

## 2024-03-08 MED ORDER — DUPILUMAB 300 MG/2ML ~~LOC~~ SOSY
300.0000 mg | PREFILLED_SYRINGE | Freq: Once | SUBCUTANEOUS | Status: AC
  Administered 2024-03-08: 300 mg via SUBCUTANEOUS

## 2024-03-08 NOTE — Progress Notes (Signed)
 atient here today for two week Dupixent  injection for severe Atopic Dermatitis.    Dupixent  300mg  injected into patient left upper arm. Patient tolerated well.     LOT: [id number]  EXP: 10/10/2025 NDC: 9975-4084-97   Stefano Saba, RMA

## 2024-03-17 ENCOUNTER — Other Ambulatory Visit: Payer: Self-pay | Admitting: Dermatology

## 2024-03-17 DIAGNOSIS — L2081 Atopic neurodermatitis: Secondary | ICD-10-CM

## 2024-03-22 ENCOUNTER — Ambulatory Visit: Admitting: Dermatology

## 2024-05-15 ENCOUNTER — Ambulatory Visit: Admitting: Internal Medicine
